# Patient Record
Sex: Female | Born: 1965 | Race: White | Hispanic: No | Marital: Married | Smoking: Former smoker
Health system: Southern US, Community
[De-identification: ages and names within clinical notes are randomized; demographics above are authoritative.]

---
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
## 2003-03-08 DIAGNOSIS — F419 Anxiety disorder, unspecified: Secondary | ICD-10-CM | POA: Insufficient documentation

## 2004-06-10 ENCOUNTER — Ambulatory Visit: Payer: Self-pay | Admitting: Family Medicine

## 2005-06-16 ENCOUNTER — Ambulatory Visit: Payer: Self-pay | Admitting: Family Medicine

## 2005-08-17 ENCOUNTER — Ambulatory Visit: Payer: Self-pay | Admitting: Otolaryngology

## 2006-08-09 ENCOUNTER — Ambulatory Visit: Payer: Self-pay | Admitting: Family Medicine

## 2006-12-01 ENCOUNTER — Ambulatory Visit: Payer: Self-pay | Admitting: Vascular Surgery

## 2007-06-14 ENCOUNTER — Ambulatory Visit: Payer: Self-pay | Admitting: Otolaryngology

## 2007-08-17 ENCOUNTER — Ambulatory Visit: Payer: Self-pay | Admitting: Family Medicine

## 2007-12-12 ENCOUNTER — Ambulatory Visit: Payer: Self-pay | Admitting: Otolaryngology

## 2008-06-04 ENCOUNTER — Ambulatory Visit: Payer: Self-pay | Admitting: Otolaryngology

## 2008-07-19 DIAGNOSIS — M129 Arthropathy, unspecified: Secondary | ICD-10-CM | POA: Insufficient documentation

## 2008-07-19 DIAGNOSIS — E041 Nontoxic single thyroid nodule: Secondary | ICD-10-CM | POA: Insufficient documentation

## 2008-09-05 ENCOUNTER — Ambulatory Visit: Payer: Self-pay

## 2009-03-20 IMAGING — US US THYROID
1 series · 17 of 25 positions shown · non-contrast
Comparison: none

REASON FOR EXAM: f/u Thyroid nodules
COMMENTS:

[Series 1: us thyroid · 17 of 29 slices shown]
[im 1/29]
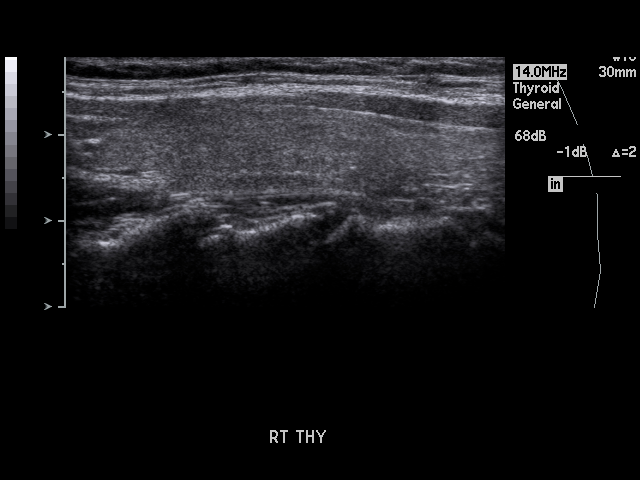
[im 3/29]
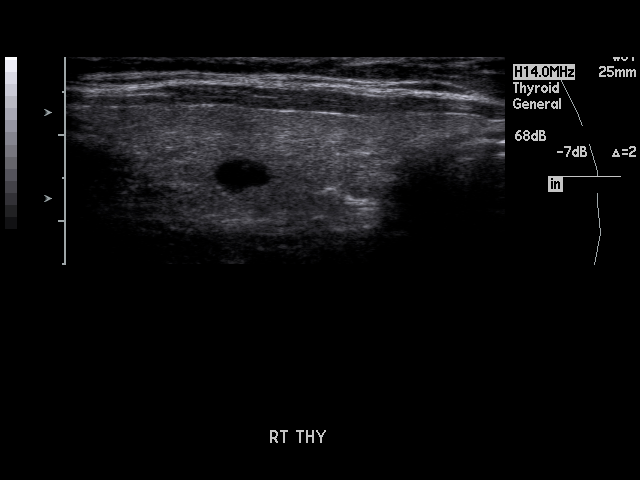
[im 4/29]
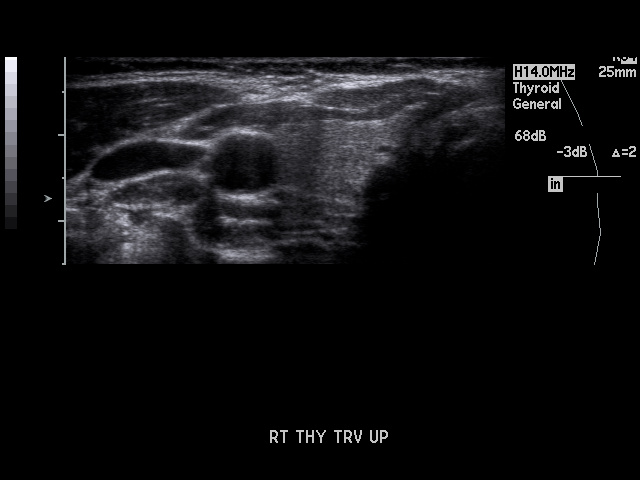
[im 6/29]
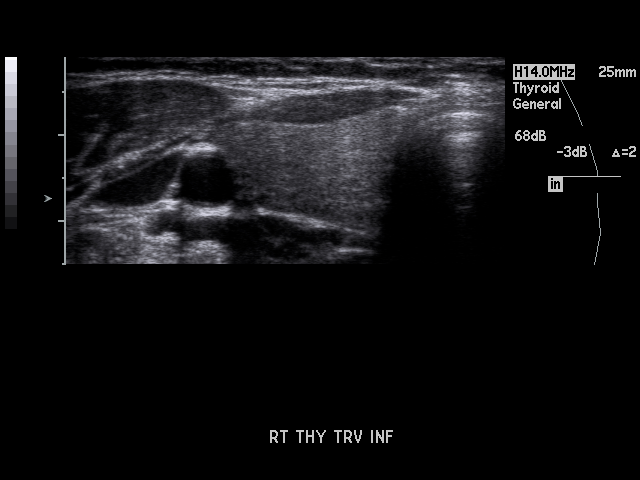
[im 8/29]
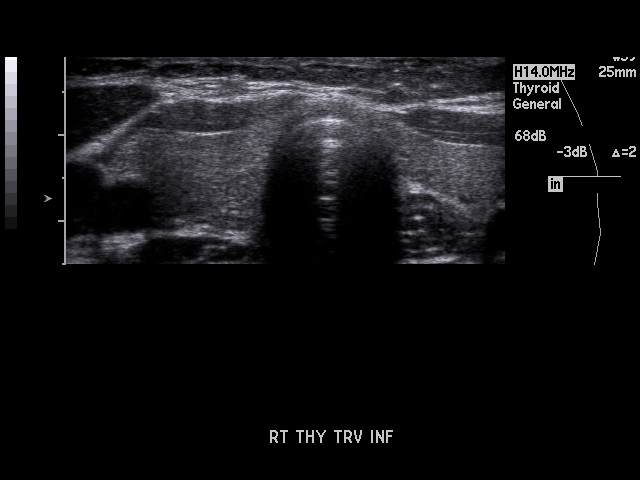
[im 10/29]
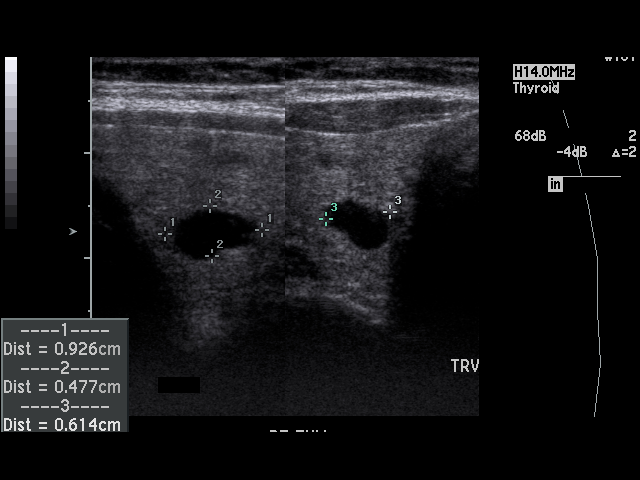
[im 11/29]
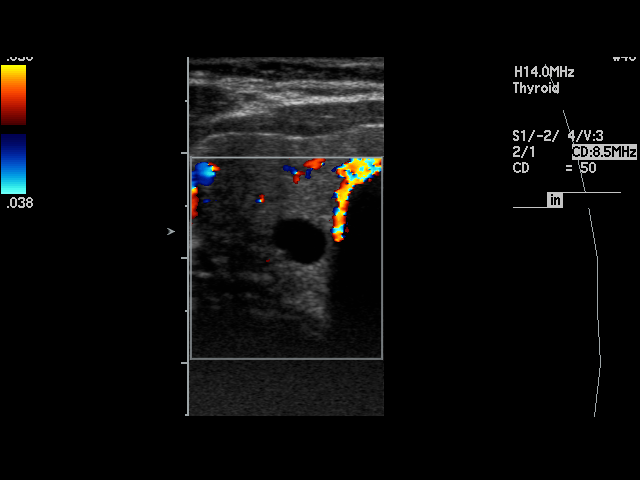
[im 13/29]
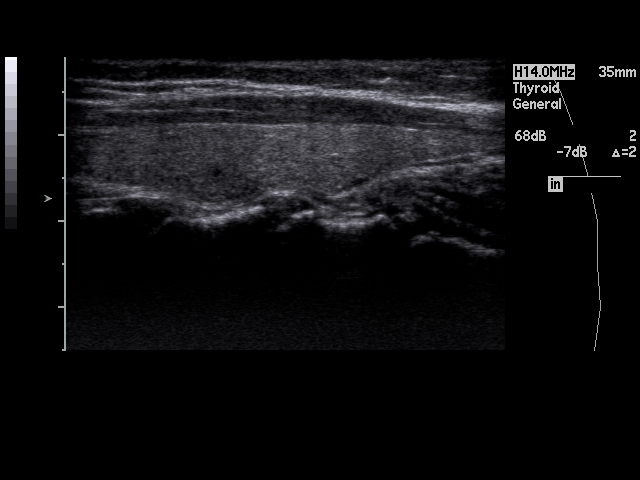
[im 15/29]
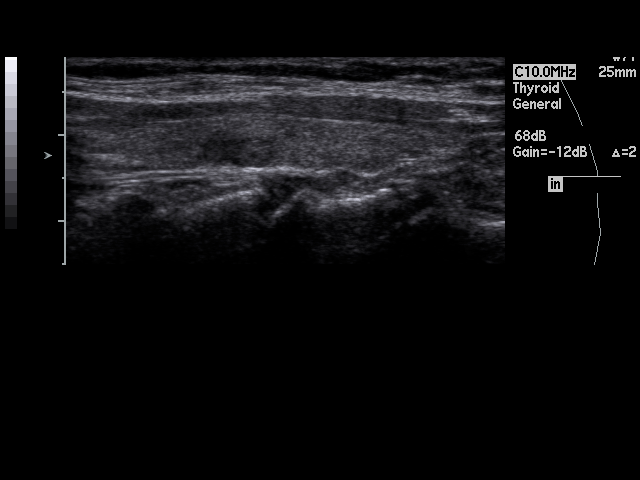
[im 16/29]
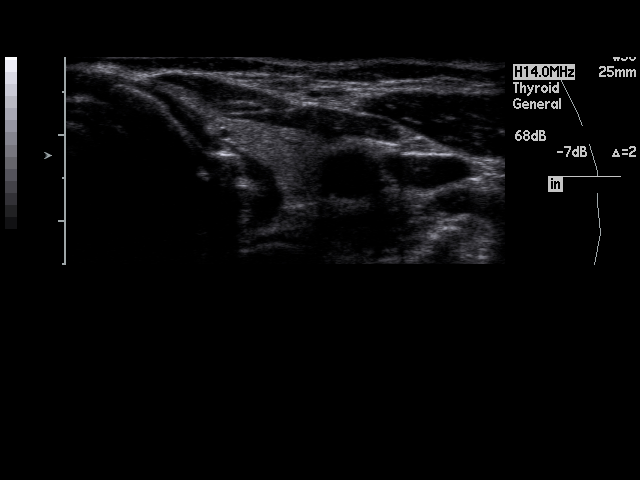
[im 18/29]
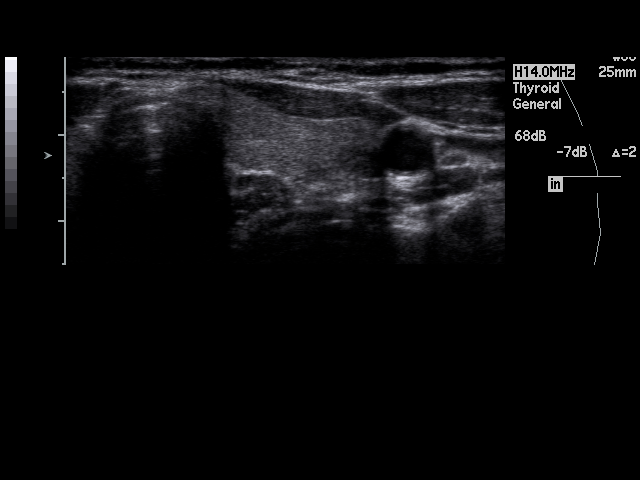
[im 19/29]
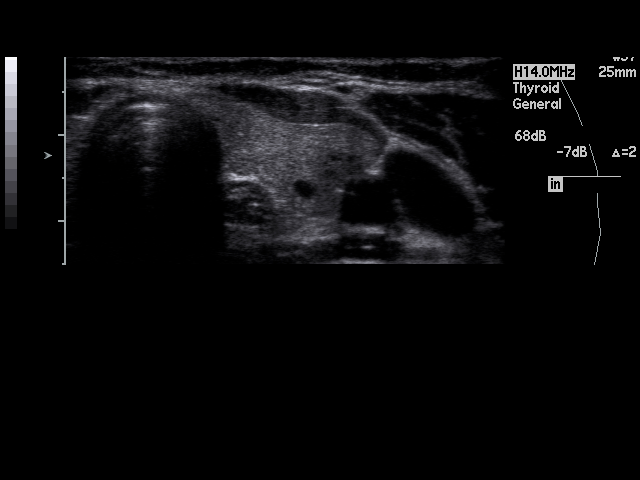
[im 22/29]
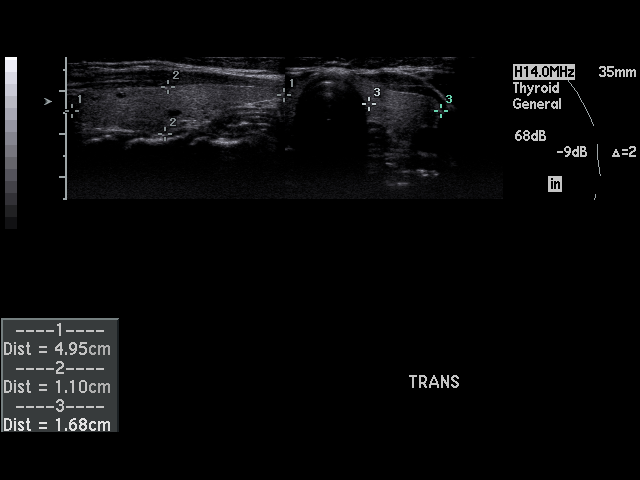
[im 23/29]
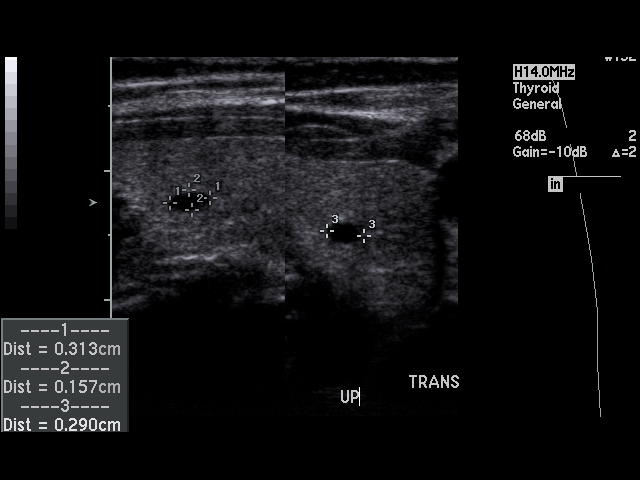
[im 25/29]
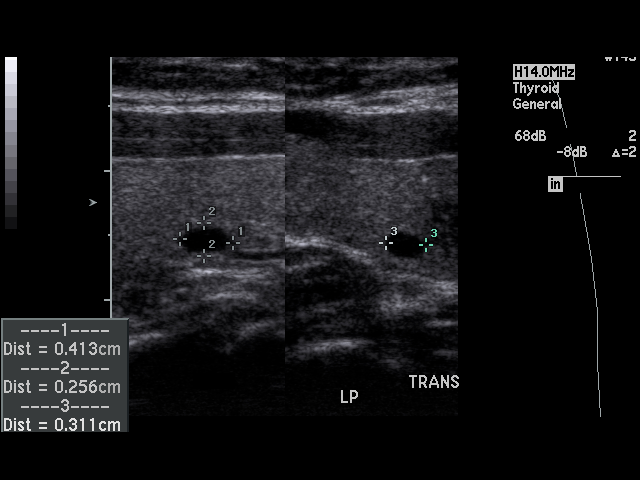
[im 26/29]
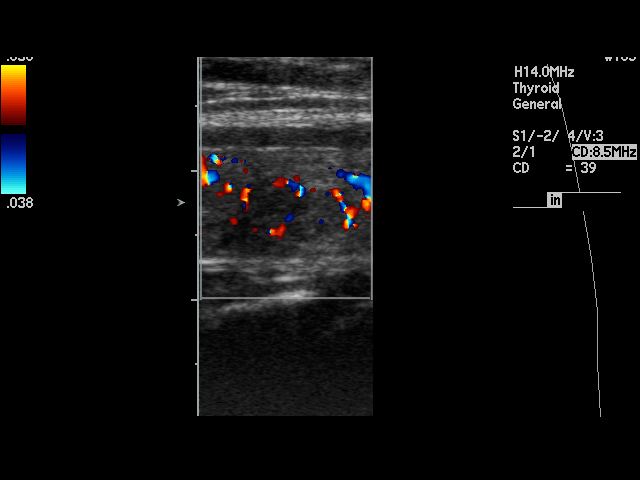
[im 29/29]
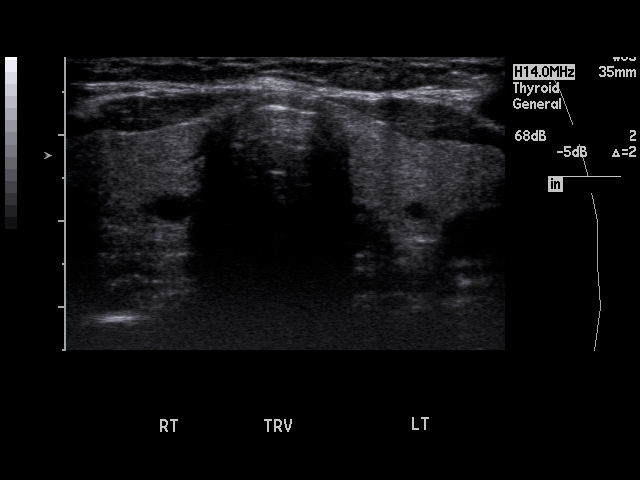

[17 of 25 positions shown; findings below may reference images not displayed]

PROCEDURE:     US  - US THYROID  - December 12, 2007 [DATE]

RESULT:     The RIGHT lobe of the thyroid measures 4.94 cm x 1.56 cm x
cm, and the LEFT lobe measures 4.95 cm x 1.1 cm x 1.6 cm.  There is a 9.3 mm
hypoechoic nodule consistent with a cyst or colloid cyst in the mid pole
region. On the LEFT, there are noted two tiny cystic-appearing nodules with
the smaller being at the upper pole and measuring 3.1 mm in diameter and the
smaller being at the lower pole and measuring 4.1 mm in diameter.
Additionally, there is a 1.03 cm hypoechoic solid nodule in the mid pole
region of the LEFT lobe. No associated microcalcifications are seen. No
other nodules are identified. The thyroid echotexture otherwise is
homogeneous.
IMPRESSION: 1.     There is a solid hypoechoic 1.033 cm nodule in the mid pole region of
the LEFT lobe.
2.     There are noted two small cystic-appearing nodules on the LEFT and
one on the RIGHT as reported above.
3.     The thyroid echotexture otherwise is homogeneous.

## 2009-06-05 ENCOUNTER — Ambulatory Visit: Payer: Self-pay | Admitting: Otolaryngology

## 2009-09-11 DIAGNOSIS — E669 Obesity, unspecified: Secondary | ICD-10-CM | POA: Insufficient documentation

## 2009-09-15 ENCOUNTER — Ambulatory Visit: Payer: Self-pay

## 2010-09-29 ENCOUNTER — Ambulatory Visit: Payer: Self-pay | Admitting: Family Medicine

## 2010-10-01 ENCOUNTER — Ambulatory Visit: Payer: Self-pay | Admitting: Family Medicine

## 2010-10-24 ENCOUNTER — Emergency Department: Payer: Self-pay | Admitting: Unknown Physician Specialty

## 2010-11-03 ENCOUNTER — Ambulatory Visit: Payer: Self-pay

## 2010-11-04 ENCOUNTER — Ambulatory Visit: Payer: Self-pay

## 2011-01-05 HISTORY — PX: KNEE SURGERY: SHX244

## 2011-01-15 ENCOUNTER — Encounter: Payer: Self-pay | Admitting: Orthopedic Surgery

## 2011-01-28 ENCOUNTER — Encounter: Payer: Self-pay | Admitting: Orthopedic Surgery

## 2011-02-27 ENCOUNTER — Encounter: Payer: Self-pay | Admitting: Orthopedic Surgery

## 2011-03-30 ENCOUNTER — Encounter: Payer: Self-pay | Admitting: Orthopedic Surgery

## 2011-04-30 ENCOUNTER — Encounter: Payer: Self-pay | Admitting: Orthopedic Surgery

## 2011-06-07 ENCOUNTER — Ambulatory Visit: Payer: Self-pay | Admitting: Otolaryngology

## 2011-11-16 ENCOUNTER — Ambulatory Visit: Payer: Self-pay | Admitting: Family Medicine

## 2012-06-07 ENCOUNTER — Ambulatory Visit: Payer: Self-pay | Admitting: Otolaryngology

## 2012-10-24 ENCOUNTER — Ambulatory Visit: Payer: Self-pay | Admitting: Family Medicine

## 2012-12-20 ENCOUNTER — Ambulatory Visit: Payer: Self-pay | Admitting: Family Medicine

## 2013-07-03 LAB — CBC AND DIFFERENTIAL
HCT: 40 % (ref 36–46)
Hemoglobin: 13.5 g/dL (ref 12.0–16.0)
PLATELETS: 297 10*3/uL (ref 150–399)
WBC: 7 10^3/mL

## 2013-07-03 LAB — HEPATIC FUNCTION PANEL
ALK PHOS: 79 U/L (ref 25–125)
ALT: 29 U/L (ref 7–35)
AST: 22 U/L (ref 13–35)
BILIRUBIN, TOTAL: 0.3 mg/dL

## 2013-07-03 LAB — HEMOGLOBIN A1C: HEMOGLOBIN A1C: 6 % (ref 4.0–6.0)

## 2013-07-03 LAB — TSH: TSH: 1.92 u[IU]/mL (ref 0.41–5.90)

## 2013-07-03 LAB — LIPID PANEL
Cholesterol: 154 mg/dL (ref 0–200)
HDL: 57 mg/dL (ref 35–70)
LDL CALC: 66 mg/dL
TRIGLYCERIDES: 154 mg/dL (ref 40–160)

## 2013-07-03 LAB — BASIC METABOLIC PANEL
BUN: 17 mg/dL (ref 4–21)
CREATININE: 0.9 mg/dL (ref 0.5–1.1)
GLUCOSE: 104 mg/dL
Potassium: 4.4 mmol/L (ref 3.4–5.3)
SODIUM: 138 mmol/L (ref 137–147)

## 2013-11-27 LAB — HM PAP SMEAR: HM Pap smear: NEGATIVE

## 2014-03-04 ENCOUNTER — Ambulatory Visit: Payer: Self-pay | Admitting: Gastroenterology

## 2014-04-03 ENCOUNTER — Ambulatory Visit: Payer: Self-pay | Admitting: Family Medicine

## 2014-11-22 DIAGNOSIS — E041 Nontoxic single thyroid nodule: Secondary | ICD-10-CM | POA: Insufficient documentation

## 2014-11-22 DIAGNOSIS — M1990 Unspecified osteoarthritis, unspecified site: Secondary | ICD-10-CM | POA: Insufficient documentation

## 2014-11-22 DIAGNOSIS — O039 Complete or unspecified spontaneous abortion without complication: Secondary | ICD-10-CM | POA: Insufficient documentation

## 2014-11-22 DIAGNOSIS — R6882 Decreased libido: Secondary | ICD-10-CM | POA: Insufficient documentation

## 2014-11-22 DIAGNOSIS — J45909 Unspecified asthma, uncomplicated: Secondary | ICD-10-CM | POA: Insufficient documentation

## 2014-11-22 DIAGNOSIS — A630 Anogenital (venereal) warts: Secondary | ICD-10-CM | POA: Insufficient documentation

## 2014-11-22 DIAGNOSIS — K219 Gastro-esophageal reflux disease without esophagitis: Secondary | ICD-10-CM | POA: Insufficient documentation

## 2014-11-22 DIAGNOSIS — F329 Major depressive disorder, single episode, unspecified: Secondary | ICD-10-CM | POA: Insufficient documentation

## 2014-11-22 DIAGNOSIS — G4700 Insomnia, unspecified: Secondary | ICD-10-CM | POA: Insufficient documentation

## 2014-11-22 DIAGNOSIS — R011 Cardiac murmur, unspecified: Secondary | ICD-10-CM | POA: Insufficient documentation

## 2014-11-22 DIAGNOSIS — N926 Irregular menstruation, unspecified: Secondary | ICD-10-CM | POA: Insufficient documentation

## 2014-11-22 DIAGNOSIS — L409 Psoriasis, unspecified: Secondary | ICD-10-CM | POA: Insufficient documentation

## 2014-11-22 DIAGNOSIS — R7303 Prediabetes: Secondary | ICD-10-CM | POA: Insufficient documentation

## 2014-11-22 DIAGNOSIS — Z818 Family history of other mental and behavioral disorders: Secondary | ICD-10-CM | POA: Insufficient documentation

## 2014-11-22 DIAGNOSIS — E559 Vitamin D deficiency, unspecified: Secondary | ICD-10-CM | POA: Insufficient documentation

## 2014-11-22 DIAGNOSIS — F32A Depression, unspecified: Secondary | ICD-10-CM | POA: Insufficient documentation

## 2014-11-22 DIAGNOSIS — R195 Other fecal abnormalities: Secondary | ICD-10-CM | POA: Insufficient documentation

## 2014-11-22 DIAGNOSIS — R0683 Snoring: Secondary | ICD-10-CM | POA: Insufficient documentation

## 2014-12-06 ENCOUNTER — Ambulatory Visit (INDEPENDENT_AMBULATORY_CARE_PROVIDER_SITE_OTHER): Payer: BLUE CROSS/BLUE SHIELD | Admitting: Physician Assistant

## 2014-12-06 ENCOUNTER — Encounter: Payer: Self-pay | Admitting: Physician Assistant

## 2014-12-06 VITALS — BP 102/70 | HR 71 | Temp 98.1°F | Resp 16 | Ht 61.5 in | Wt 182.6 lb

## 2014-12-06 DIAGNOSIS — E041 Nontoxic single thyroid nodule: Secondary | ICD-10-CM

## 2014-12-06 DIAGNOSIS — R7303 Prediabetes: Secondary | ICD-10-CM

## 2014-12-06 DIAGNOSIS — Z0000 Encounter for general adult medical examination without abnormal findings: Secondary | ICD-10-CM

## 2014-12-06 DIAGNOSIS — N926 Irregular menstruation, unspecified: Secondary | ICD-10-CM | POA: Diagnosis not present

## 2014-12-06 DIAGNOSIS — R7309 Other abnormal glucose: Secondary | ICD-10-CM | POA: Diagnosis not present

## 2014-12-06 DIAGNOSIS — N951 Menopausal and female climacteric states: Secondary | ICD-10-CM | POA: Diagnosis not present

## 2014-12-06 DIAGNOSIS — Z23 Encounter for immunization: Secondary | ICD-10-CM | POA: Diagnosis not present

## 2014-12-06 NOTE — Patient Instructions (Signed)
Health Maintenance Adopting a healthy lifestyle and getting preventive care can go a long way to promote health and wellness. Talk with your health care provider about what schedule of regular examinations is right for you. This is a good chance for you to check in with your provider about disease prevention and staying healthy. In between checkups, there are plenty of things you can do on your own. Experts have done a lot of research about which lifestyle changes and preventive measures are most likely to keep you healthy. Ask your health care provider for more information. WEIGHT AND DIET  Eat a healthy diet  Be sure to include plenty of vegetables, fruits, low-fat dairy products, and lean protein.  Do not eat a lot of foods high in solid fats, added sugars, or salt.  Get regular exercise. This is one of the most important things you can do for your health.  Most adults should exercise for at least 150 minutes each week. The exercise should increase your heart rate and make you sweat (moderate-intensity exercise).  Most adults should also do strengthening exercises at least twice a week. This is in addition to the moderate-intensity exercise.  Maintain a healthy weight  Body mass index (BMI) is a measurement that can be used to identify possible weight problems. It estimates body fat based on height and weight. Your health care provider can help determine your BMI and help you achieve or maintain a healthy weight.  For females 25 years of age and older:   A BMI below 18.5 is considered underweight.  A BMI of 18.5 to 24.9 is normal.  A BMI of 25 to 29.9 is considered overweight.  A BMI of 30 and above is considered obese.  Watch levels of cholesterol and blood lipids  You should start having your blood tested for lipids and cholesterol at 51 years of age, then have this test every 5 years.  You may need to have your cholesterol levels checked more often if:  Your lipid or  cholesterol levels are high.  You are older than 51 years of age.  You are at high risk for heart disease.  CANCER SCREENING   Lung Cancer  Lung cancer screening is recommended for adults 97-92 years old who are at high risk for lung cancer because of a history of smoking.  A yearly low-dose CT scan of the lungs is recommended for people who:  Currently smoke.  Have quit within the past 15 years.  Have at least a 30-pack-year history of smoking. A pack year is smoking an average of one pack of cigarettes a day for 1 year.  Yearly screening should continue until it has been 15 years since you quit.  Yearly screening should stop if you develop a health problem that would prevent you from having lung cancer treatment.  Breast Cancer  Practice breast self-awareness. This means understanding how your breasts normally appear and feel.  It also means doing regular breast self-exams. Let your health care provider know about any changes, no matter how small.  If you are in your 20s or 30s, you should have a clinical breast exam (CBE) by a health care provider every 1-3 years as part of a regular health exam.  If you are 76 or older, have a CBE every year. Also consider having a breast X-ray (mammogram) every year.  If you have a family history of breast cancer, talk to your health care provider about genetic screening.  If you are  at high risk for breast cancer, talk to your health care provider about having an MRI and a mammogram every year.  Breast cancer gene (BRCA) assessment is recommended for women who have family members with BRCA-related cancers. BRCA-related cancers include:  Breast.  Ovarian.  Tubal.  Peritoneal cancers.  Results of the assessment will determine the need for genetic counseling and BRCA1 and BRCA2 testing. Cervical Cancer Routine pelvic examinations to screen for cervical cancer are no longer recommended for nonpregnant women who are considered low  risk for cancer of the pelvic organs (ovaries, uterus, and vagina) and who do not have symptoms. A pelvic examination may be necessary if you have symptoms including those associated with pelvic infections. Ask your health care provider if a screening pelvic exam is right for you.   The Pap test is the screening test for cervical cancer for women who are considered at risk.  If you had a hysterectomy for a problem that was not cancer or a condition that could lead to cancer, then you no longer need Pap tests.  If you are older than 65 years, and you have had normal Pap tests for the past 10 years, you no longer need to have Pap tests.  If you have had past treatment for cervical cancer or a condition that could lead to cancer, you need Pap tests and screening for cancer for at least 20 years after your treatment.  If you no longer get a Pap test, assess your risk factors if they change (such as having a new sexual partner). This can affect whether you should start being screened again.  Some women have medical problems that increase their chance of getting cervical cancer. If this is the case for you, your health care provider may recommend more frequent screening and Pap tests.  The human papillomavirus (HPV) test is another test that may be used for cervical cancer screening. The HPV test looks for the virus that can cause cell changes in the cervix. The cells collected during the Pap test can be tested for HPV.  The HPV test can be used to screen women 30 years of age and older. Getting tested for HPV can extend the interval between normal Pap tests from three to five years.  An HPV test also should be used to screen women of any age who have unclear Pap test results.  After 51 years of age, women should have HPV testing as often as Pap tests.  Colorectal Cancer  This type of cancer can be detected and often prevented.  Routine colorectal cancer screening usually begins at 50 years of  age and continues through 51 years of age.  Your health care provider may recommend screening at an earlier age if you have risk factors for colon cancer.  Your health care provider may also recommend using home test kits to check for hidden blood in the stool.  A small camera at the end of a tube can be used to examine your colon directly (sigmoidoscopy or colonoscopy). This is done to check for the earliest forms of colorectal cancer.  Routine screening usually begins at age 50.  Direct examination of the colon should be repeated every 5-10 years through 51 years of age. However, you may need to be screened more often if early forms of precancerous polyps or small growths are found. Skin Cancer  Check your skin from head to toe regularly.  Tell your health care provider about any new moles or changes in   moles, especially if there is a change in a mole's shape or color.  Also tell your health care provider if you have a mole that is larger than the size of a pencil eraser.  Always use sunscreen. Apply sunscreen liberally and repeatedly throughout the day.  Protect yourself by wearing long sleeves, pants, a wide-brimmed hat, and sunglasses whenever you are outside. HEART DISEASE, DIABETES, AND HIGH BLOOD PRESSURE   Have your blood pressure checked at least every 1-2 years. High blood pressure causes heart disease and increases the risk of stroke.  If you are between 75 years and 42 years old, ask your health care provider if you should take aspirin to prevent strokes.  Have regular diabetes screenings. This involves taking a blood sample to check your fasting blood sugar level.  If you are at a normal weight and have a low risk for diabetes, have this test once every three years after 51 years of age.  If you are overweight and have a high risk for diabetes, consider being tested at a younger age or more often. PREVENTING INFECTION  Hepatitis B  If you have a higher risk for  hepatitis B, you should be screened for this virus. You are considered at high risk for hepatitis B if:  You were born in a country where hepatitis B is common. Ask your health care provider which countries are considered high risk.  Your parents were born in a high-risk country, and you have not been immunized against hepatitis B (hepatitis B vaccine).  You have HIV or AIDS.  You use needles to inject street drugs.  You live with someone who has hepatitis B.  You have had sex with someone who has hepatitis B.  You get hemodialysis treatment.  You take certain medicines for conditions, including cancer, organ transplantation, and autoimmune conditions. Hepatitis C  Blood testing is recommended for:  Everyone born from [birth year] through 1967.  Anyone with known risk factors for hepatitis C. Sexually transmitted infections (STIs)  You should be screened for sexually transmitted infections (STIs) including gonorrhea and chlamydia if:  You are sexually active and are younger than 51 years of age.  You are older than 51 years of age and your health care provider tells you that you are at risk for this type of infection.  Your sexual activity has changed since you were last screened and you are at an increased risk for chlamydia or gonorrhea. Ask your health care provider if you are at risk.  If you do not have HIV, but are at risk, it may be recommended that you take a prescription medicine daily to prevent HIV infection. This is called pre-exposure prophylaxis (PrEP). You are considered at risk if:  You are sexually active and do not regularly use condoms or know the HIV status of your partner(s).  You take drugs by injection.  You are sexually active with a partner who has HIV. Talk with your health care provider about whether you are at high risk of being infected with HIV. If you choose to begin PrEP, you should first be tested for HIV. You should then be tested every 3 months for  as long as you are taking PrEP.  PREGNANCY   If you are premenopausal and you may become pregnant, ask your health care provider about preconception counseling.  If you may become pregnant, take 400 to 800 micrograms (mcg) of folic acid every day.  If you want to prevent pregnancy, talk to your  health care provider about birth control (contraception). OSTEOPOROSIS AND MENOPAUSE   Osteoporosis is a disease in which the bones lose minerals and strength with aging. This can result in serious bone fractures. Your risk for osteoporosis can be identified using a bone density scan.  If you are 65 years of age or older, or if you are at risk for osteoporosis and fractures, ask your health care provider if you should be screened.  Ask your health care provider whether you should take a calcium or vitamin D supplement to lower your risk for osteoporosis.  Menopause may have certain physical symptoms and risks.  Hormone replacement therapy may reduce some of these symptoms and risks. Talk to your health care provider about whether hormone replacement therapy is right for you.  HOME CARE INSTRUCTIONS   Schedule regular health, dental, and eye exams.  Stay current with your immunizations.   Do not use any tobacco products including cigarettes, chewing tobacco, or electronic cigarettes.  If you are pregnant, do not drink alcohol.  If you are breastfeeding, limit how much and how often you drink alcohol.  Limit alcohol intake to no more than 1 drink per day for nonpregnant women. One drink equals 12 ounces of beer, 5 ounces of wine, or 1 ounces of hard liquor.  Do not use street drugs.  Do not share needles.  Ask your health care provider for help if you need support or information about quitting drugs.  Tell your health care provider if you often feel depressed.  Tell your health care provider if you have ever been abused or do not feel safe at home. Document Released: 09/28/2010  Document Revised: 07/30/2013 Document Reviewed: 02/14/2013 [employer] Patient Information 2015 [employer]. This information is not intended to replace advice given to you by your health care provider. Make sure you discuss any questions you have with your health care provider. Menopause Menopause is the normal time of life when menstrual periods stop completely. Menopause is complete when you have missed 12 consecutive menstrual periods. It usually occurs between the ages of 48 years and 55 years. Very rarely does a woman develop menopause before the age of 40 years. At menopause, your ovaries stop producing the female hormones estrogen and progesterone. This can cause undesirable symptoms and also affect your health. Sometimes the symptoms may occur 4-5 years before the menopause begins. There is no relationship between menopause and:  Oral contraceptives.  Number of children you had.  Race.  The age your menstrual periods started (menarche). Heavy smokers and very thin women may develop menopause earlier in life. CAUSES  The ovaries stop producing the female hormones estrogen and progesterone.  Other causes include:  Surgery to remove both ovaries.  The ovaries stop functioning for no known reason.  Tumors of the pituitary gland in the brain.  Medical disease that affects the ovaries and hormone production.  Radiation treatment to the abdomen or pelvis.  Chemotherapy that affects the ovaries. SYMPTOMS   Hot flashes.  Night sweats.  Decrease in sex drive.  Vaginal dryness and thinning of the vagina causing painful intercourse.  Dryness of the skin and developing wrinkles.  Headaches.  Tiredness.  Irritability.  Memory problems.  Weight gain.  Bladder infections.  Hair growth of the face and chest.  Infertility. More serious symptoms include:  Loss of bone (osteoporosis) causing breaks (fractures).  Depression.  Hardening and narrowing of the arteries  (atherosclerosis) causing heart attacks and strokes. DIAGNOSIS   When the menstrual   menstrual periods have stopped for 12 straight months.  Physical exam.  Hormone studies of the blood. TREATMENT  There are many treatment choices and nearly as many questions about them. The decisions to treat or not to treat menopausal changes is an individual choice made with your health care provider. Your health care provider can discuss the treatments with you. Together, you can decide which treatment will work best for you. Your treatment choices may include:   Hormone therapy (estrogen and progesterone).  Non-hormonal medicines.  Treating the individual symptoms with medicine (for example antidepressants for depression).  Herbal medicines that may help specific symptoms.  Counseling by a psychiatrist or psychologist.  Group therapy.  Lifestyle changes including:  Eating healthy.  Regular exercise.  Limiting caffeine and alcohol.  Stress management and meditation.  No treatment. HOME CARE INSTRUCTIONS   Take the medicine your health care provider gives you as directed.  Get plenty of sleep and rest.  Exercise regularly.  Eat a diet that contains calcium (good for the bones) and soy products (acts like estrogen hormone).  Avoid alcoholic beverages.  Do not smoke.  If you have hot flashes, dress in layers.  Take supplements, calcium, and vitamin D to strengthen bones.  You can use over-the-counter lubricants or moisturizers for vaginal dryness.  Group therapy is sometimes very helpful.  Acupuncture may be helpful in some cases. SEEK MEDICAL CARE IF:   You are not sure you are in menopause.  You are having menopausal symptoms and need advice and treatment.  You are still having menstrual periods after age 44 years.  You have pain with intercourse.  Menopause is complete (no menstrual period for 12 months) and you develop vaginal bleeding.  You need a referral to a  specialist (gynecologist, psychiatrist, or psychologist) for treatment. SEEK IMMEDIATE MEDICAL CARE IF:   You have severe depression.  You have excessive vaginal bleeding.  You fell and think you have a broken bone.  You have pain when you urinate.  You develop leg or chest pain.  You have a fast pounding heart beat (palpitations).  You have severe headaches.  You develop vision problems.  You feel a lump in your breast.  You have abdominal pain or severe indigestion. Document Released: 06/05/2003 Document Revised: 11/15/2012 Document Reviewed: 10/12/2012 [facility] Patient Information 2015 [employer] [place]. This information is not intended to replace advice given to you by your health care provider. Make sure you discuss any questions you have with your health care provider.

## 2014-12-06 NOTE — Progress Notes (Signed)
Patient: Tonya Zuniga, Female    DOB: Aug 27, 1965, 49 y.o.   MRN: [id number] Visit Date: 12/06/2014  Today's Provider: Margaretann Loveless, PA-C   Chief Complaint  Patient presents with  . Annual Exam   Subjective:    Annual physical exam Tonya Zuniga is a 49 y.o. female who presents today for health maintenance and complete physical. She feels well. She reports exercising,4-5 times a week on the treadmill for 30 minutes fast walk and weight lifting for 45 minutes twice a week. She reports she is sleeping poorly, reports sleeping 4 good hours due to sweat at night.   Last PCP:11/27/13 Mammogram:04/03/14 BI-RADS 1: Negative Colonoscopy: 03/04/14 Normal Pap Smear:11/27/13 Normal; HPV Negative EKG:09/2012 -----------------------------------------------------------------   Review of Systems  Constitutional: Positive for diaphoresis and fatigue.  HENT: Positive for tinnitus.   Eyes: Negative.   Respiratory: Negative.   Cardiovascular: Negative.   Gastrointestinal: Negative.   Endocrine: Negative.   Genitourinary: Negative.   Musculoskeletal: Negative.   Skin: Negative.   Hematological: Negative.   Psychiatric/Behavioral: Positive for sleep disturbance and decreased concentration.    Social History She  reports that she has quit smoking. She has never used smokeless tobacco. She reports that she drinks about 8.4 oz of alcohol per week. She reports that she does not use illicit drugs. Social History   Social History  . Marital Status: Married    Spouse Name: N/A  . Number of Children: N/A  . Years of Education: N/A   Social History Main Topics  . Smoking status: Former Games developer  . Smokeless tobacco: Never Used     Comment: Quit 1994  . Alcohol Use: 8.4 oz/week    14 Glasses of wine per week  . Drug Use: No  . Sexual Activity: Not Asked   Other Topics Concern  . None   Social History Narrative    Patient Active Problem List   Diagnosis Date Noted  . Abortion  11/22/2014  . Arthritis 11/22/2014  . Airway hyperreactivity 11/22/2014  . Undiagnosed cardiac murmurs 11/22/2014  . Clinical depression 11/22/2014  . Family history of psychiatric condition 11/22/2014  . Genital warts 11/22/2014  . Esophageal reflux 11/22/2014  . Fecal occult blood test positive 11/22/2014  . Cannot sleep 11/22/2014  . Irregular bleeding 11/22/2014  . Decreased libido 11/22/2014  . Borderline diabetes 11/22/2014  . 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 Snores 11/22/2014  . Thyroid nodule 11/22/2014  . Avitaminosis D 11/22/2014  . Adiposity 09/11/2009  . Arthropathia 07/19/2008  . Nontoxic uninodular goiter 07/19/2008  . Anxiety disorder 03/08/2003    Past Surgical History  Procedure Laterality Date  . Knee surgery Right 01/05/2011  . Appendectomy  1981    Family History  Family Status  Relation Status Death Age  . Mother Alive   . Father Alive   . Sister Alive   . Maternal Grandmother Deceased 67  . Maternal Grandfather Deceased 32  . Paternal Grandmother Deceased 24  . Paternal Grandfather Deceased 3   Her family history includes Alcohol abuse in her paternal grandfather; Arthritis in her mother and sister; Breast cancer in her paternal grandmother; Coronary artery disease in her father; Dementia in her maternal grandmother; Diabetes in her father; Heart attack in her maternal grandfather; Heart disease in her father; Hypertension in her sister; Meniere's disease in her father; Osteoporosis in her mother; Pancreatitis in her paternal grandfather; Pneumonia in her paternal grandmother.    No Known Allergies  Previous Medications   BUPROPION (WELLBUTRIN XL) 150 MG 24 HR TABLET    Take 1 tablet by mouth daily.   CALCIUM-MAGNESIUM-VITAMIN D (CALCIUM 500 PO)    1 tablet 2 (two) times daily as needed.   ESCITALOPRAM (LEXAPRO) 10 MG TABLET    Take 1.5 tablets by mouth daily.   FLAXSEED, LINSEED, PO    Take 1 tablet by mouth daily.   LORATADINE (CLARITIN  REDITABS) 10 MG DISSOLVABLE TABLET    Take 1 tablet by mouth daily.   MACA ROOT 500 MG CAPS    Take 1 capsule by mouth daily.   MELATONIN 10 MG CAPS    Take 1 capsule by mouth at bedtime.   METHYLSULFONYLMETHANE 500 MG CAPS    Take 1 capsule by mouth 2 (two) times daily.   MONTELUKAST (SINGULAIR) 10 MG TABLET    Take 1 tablet by mouth daily.   MULTIPLE VITAMIN PO    Take 1 tablet by mouth every other day.   RANITIDINE (ZANTAC) 150 MG TABLET    Take 1 tablet by mouth at bedtime.    Patient Care Team: Lorie Phenix, MD as PCP - General (Family Medicine)     Objective:   Vitals: BP 102/70 mmHg  Pulse 71  Temp(Src) 98.1 F (36.7 C) (Oral)  Resp 16  Ht 5' 1.5" (1.562 m)  Wt 182 lb 9.6 oz (82.827 kg)  BMI 33.95 kg/m2  LMP 10/23/2014   Physical Exam  Constitutional: She is oriented to person, place, and time. She appears well-developed and well-nourished. No distress.  HENT:  Head: Normocephalic and atraumatic.  Right Ear: External ear normal.  Left Ear: External ear normal.  Nose: Nose normal.  Mouth/Throat: Oropharynx is clear and moist. No oropharyngeal exudate.  Eyes: Conjunctivae and EOM are normal. Pupils are equal, round, and reactive to light. Right eye exhibits no discharge. Left eye exhibits no discharge. No scleral icterus.  Neck: Normal range of motion. Neck supple. No JVD present. Carotid bruit is not present. No tracheal deviation present. Thyroid mass (palpable thyroid nodule on left) present. No thyromegaly present.  Cardiovascular: Normal rate, regular rhythm, normal heart sounds and intact distal pulses.  Exam reveals no gallop and no friction rub.   No murmur heard. Pulmonary/Chest: Effort normal and breath sounds normal. No respiratory distress. She has no wheezes. She has no rales. She exhibits no tenderness. Right breast exhibits no inverted nipple, no mass, no nipple discharge, no skin change and no tenderness. Left breast exhibits no inverted nipple, no mass, no  nipple discharge, no skin change and no tenderness. Breasts are symmetrical.  Abdominal: Soft. Bowel sounds are normal. She exhibits no distension and no mass. There is no tenderness. There is no rebound and no guarding.  Genitourinary:  Pt deferred pelvic and rectal exam  Musculoskeletal: Normal range of motion. She exhibits no edema or tenderness.  Lymphadenopathy:    She has no cervical adenopathy.  Neurological: She is alert and oriented to person, place, and time.  Skin: Skin is warm and dry. No rash noted. She is not diaphoretic.  Psychiatric: She has a normal mood and affect. Her behavior is normal. Judgment and thought content normal.  Vitals reviewed.    Depression Screen No flowsheet data found.    Assessment & Plan:     Routine Health Maintenance and Physical Exam  Exercise Activities and Dietary recommendations Goals    None      Immunization History  Administered Date(s) Administered  . Influenza,inj,Quad  PF,36+ Mos 12/06/2014  . Pneumococcal Polysaccharide-23 11/23/2012  . Td 09/10/2004  . Tdap 12/27/2009    Health Maintenance  Topic Date Due  . Hepatitis C Screening  08/25/1965  . HIV Screening  06/30/1978  . INFLUENZA VACCINE  10/28/2015  . MAMMOGRAM  04/03/2016  . PAP SMEAR  11/27/2016  . TETANUS/TDAP  12/28/2019  . COLONOSCOPY  03/04/2024      Discussed health benefits of physical activity, and encouraged her to engage in regular exercise appropriate for her age and condition.     1. Annual physical exam She has annual labs done through her employer. All labs have been stable with the exception of her hemoglobin A1c which was 5.8 most recently. She is trying to eat a low carbohydrate diet and exercising. They also monitor her thyroid levels secondary to a thyroid nodule. She has been seen by Dr. Marion Downer with [facility] in the past but states it has been many years. She would like a referral back to him for evaluation follow-up.  2.  Thyroid nodule Most recent thyroid panel was within normal limits. She has previously been seen by Dr. Marion Downer at [facility] for workup of the thyroid nodule. She states this was approximately 2-3 years ago, possibly longer. I will refer her back to Dr. Willeen Cass for follow-up evaluation of the thyroid nodule. - Ambulatory referral to ENT  3. Need for influenza vaccination Flu vaccine given today. She tolerated the immunization well. - Flu Vaccine QUAD 36+ mos IM  4. Irregular bleeding She states that in February 2016 she thought that she had had her last menstrual cycle. She did not have another menstrual cycle until 10/23/2014. She states this menstrual cycle was very heavy, with more cramping, and lasted for 9 days which is unusual for her. She has not had any irregular bleeding or spotting since that time. I did advise her that if she were to have any more bleeding such as this for her to please call the office and we may consider a referral to gynecology at that time to workup postmenopausal bleeding. She does state that she did have her hormone levels checked earlier this year and they did show that she was in a menopausal state.  5. Hot flash, menopausal New and worsening. She states that the hot flashes do not bother her much during the day and that she may only have one or 2 a week during the day. However at night she states that they've come more frequently and she has not been sleeping well over the last month or so. Unfortunately due to the fact that she still has her female organs and hesitant to start her on hormone replacement therapy. We did discuss non-hormonal therapies for hot flashes including venlafaxine and paroxetine, but the fact that she is R 80 on Wellbutrin and Lexapro for her depression and anxiety I did not feel comfortable adding another antidepressant medication to her regimen to control the hot flashes. We did discuss using sleep aids instead to see if we can get her  a good night sleep. She has used Ambien in the past and states that she had a bad experience once and discontinued use. She does currently take melatonin but it is not helping her sleep. It may also cause hot flashes as well so we discussed discontinuing melatonin and we'll switch her to over-the-counter Unisom. She is to call the office in 1-2 weeks if the Unisom is not working and we will switch  to another sleep aid.  6. Borderline diabetes She is being followed for this through her employer. Most recent hemoglobin A1c was 5.8 from September 2016. She is to continue her low carbohydrate, low-fat diet and exercise 4-5 times weekly as she is doing. --------------------------------------------------------------------

## 2014-12-13 ENCOUNTER — Other Ambulatory Visit: Payer: Self-pay | Admitting: Otolaryngology

## 2014-12-13 DIAGNOSIS — E041 Nontoxic single thyroid nodule: Secondary | ICD-10-CM

## 2014-12-19 ENCOUNTER — Ambulatory Visit: Payer: BLUE CROSS/BLUE SHIELD

## 2014-12-23 ENCOUNTER — Ambulatory Visit: Payer: BLUE CROSS/BLUE SHIELD

## 2014-12-24 ENCOUNTER — Ambulatory Visit
Admission: RE | Admit: 2014-12-24 | Discharge: 2014-12-24 | Disposition: A | Discharge status: Home / Self Care | Payer: BLUE CROSS/BLUE SHIELD | Source: Ambulatory Visit | Attending: Otolaryngology | Admitting: Otolaryngology

## 2014-12-24 DIAGNOSIS — E041 Nontoxic single thyroid nodule: Secondary | ICD-10-CM

## 2014-12-24 DIAGNOSIS — E042 Nontoxic multinodular goiter: Secondary | ICD-10-CM | POA: Insufficient documentation

## 2014-12-27 ENCOUNTER — Other Ambulatory Visit: Payer: Self-pay | Admitting: Family Medicine

## 2014-12-27 DIAGNOSIS — J309 Allergic rhinitis, unspecified: Secondary | ICD-10-CM

## 2015-02-06 ENCOUNTER — Other Ambulatory Visit: Payer: Self-pay

## 2015-02-06 DIAGNOSIS — F329 Major depressive disorder, single episode, unspecified: Secondary | ICD-10-CM

## 2015-02-06 DIAGNOSIS — F32A Depression, unspecified: Secondary | ICD-10-CM

## 2015-02-06 MED ORDER — BUPROPION HCL ER (XL) 150 MG PO TB24: 150.0000 mg | ORAL_TABLET | Freq: Every day | ORAL | Status: DC

## 2015-03-17 ENCOUNTER — Other Ambulatory Visit: Payer: Self-pay | Admitting: Family Medicine

## 2015-03-17 DIAGNOSIS — F329 Major depressive disorder, single episode, unspecified: Secondary | ICD-10-CM

## 2015-03-17 DIAGNOSIS — F32A Depression, unspecified: Secondary | ICD-10-CM

## 2015-03-17 MED ORDER — ESCITALOPRAM OXALATE 10 MG PO TABS: 15.0000 mg | ORAL_TABLET | Freq: Every day | ORAL | Status: DC

## 2015-03-17 MED ORDER — BUPROPION HCL ER (XL) 150 MG PO TB24: 150.0000 mg | ORAL_TABLET | Freq: Every day | ORAL | Status: DC

## 2015-03-17 NOTE — Telephone Encounter (Signed)
Pt called saying she is out of town for the holiday and left her RX at home.  [place] [facility] [place]  On [address]  The number at the store is [phone].  escitalopram (LEXAPRO) 10 MG tablet buPROPion (WELLBUTRIN XL) 150 MG 24 hr tablet  She only needs 2 week rx  Her call back is [phone]  Thanks Barth Kirkseri

## 2015-06-21 ENCOUNTER — Other Ambulatory Visit: Payer: Self-pay | Admitting: Family Medicine

## 2015-06-21 DIAGNOSIS — J309 Allergic rhinitis, unspecified: Secondary | ICD-10-CM

## 2015-06-24 ENCOUNTER — Other Ambulatory Visit: Payer: Self-pay | Admitting: Family Medicine

## 2015-06-25 NOTE — Telephone Encounter (Signed)
See refill request.

## 2015-07-07 ENCOUNTER — Other Ambulatory Visit: Payer: Self-pay | Admitting: Family Medicine

## 2015-07-07 DIAGNOSIS — Z1231 Encounter for screening mammogram for malignant neoplasm of breast: Secondary | ICD-10-CM

## 2015-07-09 ENCOUNTER — Telehealth: Payer: Self-pay | Admitting: Family Medicine

## 2015-07-09 ENCOUNTER — Ambulatory Visit
Admission: RE | Admit: 2015-07-09 | Discharge: 2015-07-09 | Disposition: A | Discharge status: Home / Self Care | Payer: BLUE CROSS/BLUE SHIELD | Source: Ambulatory Visit | Attending: Family Medicine | Admitting: Family Medicine

## 2015-07-09 DIAGNOSIS — N6459 Other signs and symptoms in breast: Secondary | ICD-10-CM

## 2015-07-09 DIAGNOSIS — Z1231 Encounter for screening mammogram for malignant neoplasm of breast: Secondary | ICD-10-CM

## 2015-07-09 NOTE — Telephone Encounter (Signed)
Pt called saying she went in to have her screening mamo but she had noticed a new dimple so they told her to call us and get an order for a diagnostic mamo.  She went to [facility].  Pt's call back is [phone]  Thanks Barth Kirkseri

## 2015-07-09 NOTE — Telephone Encounter (Signed)
Patient advised that orders are put in and to call [facility] back and make appointment. The reason was put in per patient finding. We may need to see patient first for this if [facility] does not approve the order without the note, will await to hear back-aa

## 2015-07-21 ENCOUNTER — Other Ambulatory Visit: Payer: Self-pay | Admitting: Family Medicine

## 2015-07-21 ENCOUNTER — Ambulatory Visit
Admission: RE | Admit: 2015-07-21 | Discharge: 2015-07-21 | Disposition: A | Discharge status: Home / Self Care | Payer: BLUE CROSS/BLUE SHIELD | Source: Ambulatory Visit | Attending: Family Medicine | Admitting: Family Medicine

## 2015-07-21 DIAGNOSIS — N6459 Other signs and symptoms in breast: Secondary | ICD-10-CM

## 2015-07-21 DIAGNOSIS — N6489 Other specified disorders of breast: Secondary | ICD-10-CM | POA: Diagnosis present

## 2015-08-12 ENCOUNTER — Other Ambulatory Visit: Payer: Self-pay | Admitting: Physician Assistant

## 2015-08-12 DIAGNOSIS — F32A Depression, unspecified: Secondary | ICD-10-CM

## 2015-08-12 DIAGNOSIS — F329 Major depressive disorder, single episode, unspecified: Secondary | ICD-10-CM

## 2015-09-07 ENCOUNTER — Other Ambulatory Visit: Payer: Self-pay | Admitting: Physician Assistant

## 2015-09-07 DIAGNOSIS — F329 Major depressive disorder, single episode, unspecified: Secondary | ICD-10-CM

## 2015-09-07 DIAGNOSIS — F32A Depression, unspecified: Secondary | ICD-10-CM

## 2015-10-10 ENCOUNTER — Other Ambulatory Visit: Payer: Self-pay | Admitting: Physician Assistant

## 2015-10-10 DIAGNOSIS — F329 Major depressive disorder, single episode, unspecified: Secondary | ICD-10-CM

## 2015-10-10 DIAGNOSIS — F32A Depression, unspecified: Secondary | ICD-10-CM

## 2015-11-10 ENCOUNTER — Other Ambulatory Visit: Payer: Self-pay

## 2015-11-10 DIAGNOSIS — J309 Allergic rhinitis, unspecified: Secondary | ICD-10-CM

## 2015-11-10 DIAGNOSIS — F329 Major depressive disorder, single episode, unspecified: Secondary | ICD-10-CM

## 2015-11-10 DIAGNOSIS — F32A Depression, unspecified: Secondary | ICD-10-CM

## 2015-11-10 MED ORDER — BUPROPION HCL ER (XL) 150 MG PO TB24: ORAL_TABLET | ORAL | 1 refills | Status: DC

## 2015-11-10 MED ORDER — ESCITALOPRAM OXALATE 10 MG PO TABS: ORAL_TABLET | ORAL | 1 refills | Status: DC

## 2015-11-10 MED ORDER — MONTELUKAST SODIUM 10 MG PO TABS: 10.0000 mg | ORAL_TABLET | Freq: Every day | ORAL | 1 refills | Status: DC

## 2015-11-10 MED ORDER — LORATADINE 10 MG PO TBDP: 10.0000 mg | ORAL_TABLET | Freq: Every day | ORAL | 1 refills | Status: DC

## 2015-11-10 NOTE — Telephone Encounter (Signed)
Pharmacy requesting 90 day supply

## 2015-12-10 ENCOUNTER — Ambulatory Visit (INDEPENDENT_AMBULATORY_CARE_PROVIDER_SITE_OTHER): Payer: BLUE CROSS/BLUE SHIELD | Admitting: Physician Assistant

## 2015-12-10 ENCOUNTER — Encounter: Payer: Self-pay | Admitting: Physician Assistant

## 2015-12-10 VITALS — BP 122/74 | HR 96 | Temp 99.4°F | Resp 16 | Ht 61.0 in | Wt 159.0 lb

## 2015-12-10 DIAGNOSIS — Z0000 Encounter for general adult medical examination without abnormal findings: Secondary | ICD-10-CM | POA: Diagnosis not present

## 2015-12-10 DIAGNOSIS — Z13220 Encounter for screening for lipoid disorders: Secondary | ICD-10-CM | POA: Diagnosis not present

## 2015-12-10 DIAGNOSIS — R7303 Prediabetes: Secondary | ICD-10-CM

## 2015-12-10 DIAGNOSIS — Z1159 Encounter for screening for other viral diseases: Secondary | ICD-10-CM

## 2015-12-10 DIAGNOSIS — N951 Menopausal and female climacteric states: Secondary | ICD-10-CM

## 2015-12-10 DIAGNOSIS — E041 Nontoxic single thyroid nodule: Secondary | ICD-10-CM

## 2015-12-10 DIAGNOSIS — Z136 Encounter for screening for cardiovascular disorders: Secondary | ICD-10-CM | POA: Diagnosis not present

## 2015-12-10 DIAGNOSIS — J069 Acute upper respiratory infection, unspecified: Secondary | ICD-10-CM

## 2015-12-10 MED ORDER — ESTRADIOL 0.5 MG PO TABS: 0.5000 mg | ORAL_TABLET | Freq: Every day | ORAL | 5 refills | Status: DC

## 2015-12-10 MED ORDER — MEDROXYPROGESTERONE ACETATE 2.5 MG PO TABS: 2.5000 mg | ORAL_TABLET | Freq: Every day | ORAL | 5 refills | Status: DC

## 2015-12-10 MED ORDER — AMOXICILLIN-POT CLAVULANATE 875-125 MG PO TABS: 1.0000 | ORAL_TABLET | Freq: Two times a day (BID) | ORAL | 0 refills | Status: DC

## 2015-12-10 NOTE — Progress Notes (Signed)
Patient: Tonya Zuniga, Female    DOB: [birth year], 50 y.o.   MRN: [id number] Visit Date: 12/10/2015  Today's Provider: Margaretann Loveless, PA-C   No chief complaint on file.  Subjective:    Annual physical exam Tonya Zuniga is a 50 y.o. female who presents today for health maintenance and complete physical. She feels fairly well. She reports exercising occasionally. She reports she is sleeping poorly due to an URI that she has had for the last week.    Review of Systems  Constitutional: Positive for activity change and fatigue. Negative for appetite change, chills, diaphoresis, fever and unexpected weight change.  HENT: Positive for congestion, postnasal drip, sinus pressure and sore throat.   Respiratory: Positive for cough. Negative for apnea, choking, chest tightness, shortness of breath, wheezing and stridor.   Cardiovascular: Negative.   Endocrine: Negative.   Genitourinary: Negative.   Musculoskeletal: Negative.   Skin: Negative.   Allergic/Immunologic: Negative.   Neurological: Negative.   Hematological: Negative.     Social History      She  reports that she has quit smoking. She has never used smokeless tobacco. She reports that she drinks about 8.4 oz of alcohol per week . She reports that she does not use drugs.       Social History   Social History  . Marital status: Married    Spouse name: N/A  . Number of children: N/A  . Years of education: N/A   Social History Main Topics  . Smoking status: Former Games developer  . Smokeless tobacco: Never Used     Comment: Quit 1994  . Alcohol use 8.4 oz/week    14 Glasses of wine per week  . Drug use: No  . Sexual activity: Not on file   Other Topics Concern  . Not on file   Social History Narrative  . No narrative on file    No past medical history on file.   Patient Active Problem List   Diagnosis Date Noted  . Allergic rhinitis 12/27/2014  . Hot flash, menopausal 12/06/2014  . Arthritis 11/22/2014  .  Airway hyperreactivity 11/22/2014  . Undiagnosed cardiac murmurs 11/22/2014  . Clinical depression 11/22/2014  . Family history of psychiatric condition 11/22/2014  . Esophageal reflux 11/22/2014  . Cannot sleep 11/22/2014  . Irregular bleeding 11/22/2014  . Decreased libido 11/22/2014  . Borderline diabetes 11/22/2014  . 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 Snores 11/22/2014  . Thyroid nodule 11/22/2014  . Avitaminosis D 11/22/2014  . Adiposity 09/11/2009  . Nontoxic uninodular goiter 07/19/2008  . Anxiety disorder 03/08/2003    Past Surgical History:  Procedure Laterality Date  . APPENDECTOMY  1981  . KNEE SURGERY Right 01/05/2011    Family History        Family Status  Relation Status  . Mother Alive  . Father Alive  . Sister Alive  . Maternal Grandmother Deceased at age 62  . Maternal Grandfather Deceased at age 41  . Paternal Grandmother Deceased at age 54  . Paternal Grandfather Deceased at age 29        Her family history includes Alcohol abuse in her paternal grandfather; Arthritis in her mother and sister; Breast cancer (age of onset: 18) in her paternal grandmother; Coronary artery disease in her father; Dementia in her maternal grandmother; Diabetes in her father; Heart attack in her maternal grandfather; Heart disease in her father; Hypertension in her sister; Meniere's disease in her father;  Osteoporosis in her mother; Pancreatitis in her paternal grandfather; Pneumonia in her paternal grandmother.    No Known Allergies  No outpatient prescriptions have been marked as taking for the 12/10/15 encounter (Appointment) with Margaretann LovelessJennifer M Parmvir Boomer, PA-C.    Patient Care Team: Margaretann LovelessJennifer M Shanah Guimaraes, PA-C as PCP - General (Family Medicine)     Objective:   Vitals: LMP 10/23/2014 Comment: It lasted 9 days and it was very heavy. After not having it for 6 months.   Physical Exam  Constitutional: She is oriented to person, place, and time. She appears well-developed and  well-nourished. No distress.  HENT:  Head: Normocephalic and atraumatic.  Right Ear: Tympanic membrane, external ear and ear canal normal.  Left Ear: Tympanic membrane, external ear and ear canal normal.  Nose: Mucosal edema present. Right sinus exhibits no maxillary sinus tenderness and no frontal sinus tenderness. Left sinus exhibits no maxillary sinus tenderness and no frontal sinus tenderness.  Mouth/Throat: Uvula is midline and mucous membranes are normal. Posterior oropharyngeal erythema present. No oropharyngeal exudate or posterior oropharyngeal edema.  Eyes: Conjunctivae and EOM are normal. Pupils are equal, round, and reactive to light. Right eye exhibits no discharge. Left eye exhibits no discharge. No scleral icterus.  Neck: Normal range of motion. Neck supple. No JVD present. No tracheal deviation present. No thyromegaly present.  Cardiovascular: Normal rate, regular rhythm, normal heart sounds and intact distal pulses.  Exam reveals no gallop and no friction rub.   No murmur heard. Pulmonary/Chest: Effort normal and breath sounds normal. No respiratory distress. She has no wheezes. She has no rales. She exhibits no tenderness. Right breast exhibits no inverted nipple, no mass, no nipple discharge, no skin change and no tenderness. Left breast exhibits no inverted nipple, no mass, no nipple discharge, no skin change and no tenderness. Breasts are symmetrical.  Abdominal: Soft. Bowel sounds are normal. She exhibits no distension and no mass. There is no tenderness. There is no rebound and no guarding.  Musculoskeletal: Normal range of motion. She exhibits no edema or tenderness.  Lymphadenopathy:    She has no cervical adenopathy.  Neurological: She is alert and oriented to person, place, and time.  Skin: Skin is warm and dry. No rash noted. She is not diaphoretic.  Psychiatric: She has a normal mood and affect. Her behavior is normal. Judgment and thought content normal.  Vitals  reviewed.   Depression Screen No flowsheet data found.    Assessment & Plan:     Routine Health Maintenance and Physical Exam  Exercise Activities and Dietary recommendations Goals    None      Immunization History  Administered Date(s) Administered  . Influenza,inj,Quad PF,36+ Mos 12/06/2014  . Pneumococcal Polysaccharide-23 11/23/2012  . Td 09/10/2004  . Tdap 12/27/2009    Health Maintenance  Topic Date Due  . Hepatitis C Screening  1965-09-28  . HIV Screening  06/30/1978  . INFLUENZA VACCINE  10/28/2015  . PAP SMEAR  11/27/2016  . MAMMOGRAM  07/20/2017  . TETANUS/TDAP  12/28/2019  . COLONOSCOPY  03/04/2024      Discussed health benefits of physical activity, and encouraged her to engage in regular exercise appropriate for her age and condition.    1. Annual physical exam Normal physical exam today. Will check labs as below and f/u pending lab results. If labs are stable and WNL she will not need to have these rechecked for one year at her next annual physical exam. She is to call the office in  the meantime if she has any acute issue, questions or concerns. - CBC with Differential - Comprehensive metabolic panel  2. Nontoxic uninodular goiter Stable. Will check labs as below and f/u pending results. - TSH  3. Borderline diabetes Diet controlled. Will check labs as below and f/u pending results. - Hemoglobin A1c  4. Hot flash, menopausal Worsening menopausal symptoms. She is currently already on Lexapro and Wellbutrin for her anxiety does I do not feel she would be a good candidate to add one of the other nonhormonal treatments. She does currently have her reproductive organs and we discussed in detail risk associated with starting hormonal replacement therapy with this. She voiced understanding and agrees to go ahead and start hormone replacement. We will start with low-dose estradiol and Provera. She is to call if this does not control symptoms completely. We  will monitor for adverse reactions with medications. - TSH - estradiol (ESTRACE) 0.5 MG tablet; Take 1 tablet (0.5 mg total) by mouth daily.  Dispense: 30 tablet; Refill: 5 - medroxyPROGESTERone (PROVERA) 2.5 MG tablet; Take 1 tablet (2.5 mg total) by mouth daily.  Dispense: 30 tablet; Refill: 5  5. Encounter for lipid screening for cardiovascular disease Will check labs as below and f/u pending results. - Lipid panel  6. Need for hepatitis C screening test - Hepatitis C antibody screen  7. Upper respiratory infection Worsening symptoms. She has had no improvement over the last week and a half. I will treat her with Augmentin as below. She is to call the office if there is no improvement in symptoms following treatment. - amoxicillin-clavulanate (AUGMENTIN) 875-125 MG tablet; Take 1 tablet by mouth 2 (two) times daily.  Dispense: 20 tablet; Refill: 0  --------------------------------------------------------------------    Margaretann Loveless, PA-C  [facility]

## 2015-12-10 NOTE — Patient Instructions (Signed)
Health Maintenance, Female Adopting a healthy lifestyle and getting preventive care can go a long way to promote health and wellness. Talk with your health care provider about what schedule of regular examinations is right for you. This is a good chance for you to check in with your provider about disease prevention and staying healthy. In between checkups, there are plenty of things you can do on your own. Experts have done a lot of research about which lifestyle changes and preventive measures are most likely to keep you healthy. Ask your health care provider for more information. WEIGHT AND DIET  Eat a healthy diet  Be sure to include plenty of vegetables, fruits, low-fat dairy products, and lean protein.  Do not eat a lot of foods high in solid fats, added sugars, or salt.  Get regular exercise. This is one of the most important things you can do for your health.  Most adults should exercise for at least 150 minutes each week. The exercise should increase your heart rate and make you sweat (moderate-intensity exercise).  Most adults should also do strengthening exercises at least twice a week. This is in addition to the moderate-intensity exercise.  Maintain a healthy weight  Body mass index (BMI) is a measurement that can be used to identify possible weight problems. It estimates body fat based on height and weight. Your health care provider can help determine your BMI and help you achieve or maintain a healthy weight.  For females 28 years of age and older:   A BMI below 18.5 is considered underweight.  A BMI of 18.5 to 24.9 is normal.  A BMI of 25 to 29.9 is considered overweight.  A BMI of 30 and above is considered obese.  Watch levels of cholesterol and blood lipids  You should start having your blood tested for lipids and cholesterol at 52 years of age, then have this test every 5 years.  You may need to have your cholesterol levels checked more often if:  Your lipid  or cholesterol levels are high.  You are older than 52 years of age.  You are at high risk for heart disease.  CANCER SCREENING   Lung Cancer  Lung cancer screening is recommended for adults 75-66 years old who are at high risk for lung cancer because of a history of smoking.  A yearly low-dose CT scan of the lungs is recommended for people who:  Currently smoke.  Have quit within the past 15 years.  Have at least a 30-pack-year history of smoking. A pack year is smoking an average of one pack of cigarettes a day for 1 year.  Yearly screening should continue until it has been 15 years since you quit.  Yearly screening should stop if you develop a health problem that would prevent you from having lung cancer treatment.  Breast Cancer  Practice breast self-awareness. This means understanding how your breasts normally appear and feel.  It also means doing regular breast self-exams. Let your health care provider know about any changes, no matter how small.  If you are in your 20s or 30s, you should have a clinical breast exam (CBE) by a health care provider every 1-3 years as part of a regular health exam.  If you are 25 or older, have a CBE every year. Also consider having a breast X-ray (mammogram) every year.  If you have a family history of breast cancer, talk to your health care provider about genetic screening.  If you  are at high risk for breast cancer, talk to your health care provider about having an MRI and a mammogram every year.  Breast cancer gene (BRCA) assessment is recommended for women who have family members with BRCA-related cancers. BRCA-related cancers include:  Breast.  Ovarian.  Tubal.  Peritoneal cancers.  Results of the assessment will determine the need for genetic counseling and BRCA1 and BRCA2 testing. Cervical Cancer Your health care provider may recommend that you be screened regularly for cancer of the pelvic organs (ovaries, uterus, and  vagina). This screening involves a pelvic examination, including checking for microscopic changes to the surface of your cervix (Pap test). You may be encouraged to have this screening done every 3 years, beginning at age 21.  For women ages 30-65, health care providers may recommend pelvic exams and Pap testing every 3 years, or they may recommend the Pap and pelvic exam, combined with testing for human papilloma virus (HPV), every 5 years. Some types of HPV increase your risk of cervical cancer. Testing for HPV may also be done on women of any age with unclear Pap test results.  Other health care providers may not recommend any screening for nonpregnant women who are considered low risk for pelvic cancer and who do not have symptoms. Ask your health care provider if a screening pelvic exam is right for you.  If you have had past treatment for cervical cancer or a condition that could lead to cancer, you need Pap tests and screening for cancer for at least 20 years after your treatment. If Pap tests have been discontinued, your risk factors (such as having a new sexual partner) need to be reassessed to determine if screening should resume. Some women have medical problems that increase the chance of getting cervical cancer. In these cases, your health care provider may recommend more frequent screening and Pap tests. Colorectal Cancer  This type of cancer can be detected and often prevented.  Routine colorectal cancer screening usually begins at 52 years of age and continues through 52 years of age.  Your health care provider may recommend screening at an earlier age if you have risk factors for colon cancer.  Your health care provider may also recommend using home test kits to check for hidden blood in the stool.  A small camera at the end of a tube can be used to examine your colon directly (sigmoidoscopy or colonoscopy). This is done to check for the earliest forms of colorectal  cancer.  Routine screening usually begins at age 50.  Direct examination of the colon should be repeated every 5-10 years through 52 years of age. However, you may need to be screened more often if early forms of precancerous polyps or small growths are found. Skin Cancer  Check your skin from head to toe regularly.  Tell your health care provider about any new moles or changes in moles, especially if there is a change in a mole's shape or color.  Also tell your health care provider if you have a mole that is larger than the size of a pencil eraser.  Always use sunscreen. Apply sunscreen liberally and repeatedly throughout the day.  Protect yourself by wearing long sleeves, pants, a wide-brimmed hat, and sunglasses whenever you are outside. HEART DISEASE, DIABETES, AND HIGH BLOOD PRESSURE   High blood pressure causes heart disease and increases the risk of stroke. High blood pressure is more likely to develop in:  People who have blood pressure in the high end   of the normal range (130-139/85-89 mm Hg).  People who are overweight or obese.  People who are African American.  If you are 38-23 years of age, have your blood pressure checked every 3-5 years. If you are 61 years of age or older, have your blood pressure checked every year. You should have your blood pressure measured twice--once when you are at a hospital or clinic, and once when you are not at a hospital or clinic. Record the average of the two measurements. To check your blood pressure when you are not at a hospital or clinic, you can use:  An automated blood pressure machine at a pharmacy.  A home blood pressure monitor.  If you are between 45 years and 39 years old, ask your health care provider if you should take aspirin to prevent strokes.  Have regular diabetes screenings. This involves taking a blood sample to check your fasting blood sugar level.  If you are at a normal weight and have a low risk for diabetes,  have this test once every three years after 52 years of age.  If you are overweight and have a high risk for diabetes, consider being tested at a younger age or more often. PREVENTING INFECTION  Hepatitis B  If you have a higher risk for hepatitis B, you should be screened for this virus. You are considered at high risk for hepatitis B if:  You were born in a country where hepatitis B is common. Ask your health care provider which countries are considered high risk.  Your parents were born in a high-risk country, and you have not been immunized against hepatitis B (hepatitis B vaccine).  You have HIV or AIDS.  You use needles to inject street drugs.  You live with someone who has hepatitis B.  You have had sex with someone who has hepatitis B.  You get hemodialysis treatment.  You take certain medicines for conditions, including cancer, organ transplantation, and autoimmune conditions. Hepatitis C  Blood testing is recommended for:  Everyone born from [birth year] through 1967.  Anyone with known risk factors for hepatitis C. Sexually transmitted infections (STIs)  You should be screened for sexually transmitted infections (STIs) including gonorrhea and chlamydia if:  You are sexually active and are younger than 52 years of age.  You are older than 53 years of age and your health care provider tells you that you are at risk for this type of infection.  Your sexual activity has changed since you were last screened and you are at an increased risk for chlamydia or gonorrhea. Ask your health care provider if you are at risk.  If you do not have HIV, but are at risk, it may be recommended that you take a prescription medicine daily to prevent HIV infection. This is called pre-exposure prophylaxis (PrEP). You are considered at risk if:  You are sexually active and do not regularly use condoms or know the HIV status of your partner(s).  You take drugs by injection.  You are sexually  active with a partner who has HIV. Talk with your health care provider about whether you are at high risk of being infected with HIV. If you choose to begin PrEP, you should first be tested for HIV. You should then be tested every 3 months for as long as you are taking PrEP.  PREGNANCY   If you are premenopausal and you may become pregnant, ask your health care provider about preconception counseling.  If you may  become pregnant, take 400 to 800 micrograms (mcg) of folic acid every day.  If you want to prevent pregnancy, talk to your health care provider about birth control (contraception). OSTEOPOROSIS AND MENOPAUSE   Osteoporosis is a disease in which the bones lose minerals and strength with aging. This can result in serious bone fractures. Your risk for osteoporosis can be identified using a bone density scan.  If you are 61 years of age or older, or if you are at risk for osteoporosis and fractures, ask your health care provider if you should be screened.  Ask your health care provider whether you should take a calcium or vitamin D supplement to lower your risk for osteoporosis.  Menopause may have certain physical symptoms and risks.  Hormone replacement therapy may reduce some of these symptoms and risks. Talk to your health care provider about whether hormone replacement therapy is right for you.  HOME CARE INSTRUCTIONS   Schedule regular health, dental, and eye exams.  Stay current with your immunizations.   Do not use any tobacco products including cigarettes, chewing tobacco, or electronic cigarettes.  If you are pregnant, do not drink alcohol.  If you are breastfeeding, limit how much and how often you drink alcohol.  Limit alcohol intake to no more than 1 drink per day for nonpregnant women. One drink equals 12 ounces of beer, 5 ounces of wine, or 1 ounces of hard liquor.  Do not use street drugs.  Do not share needles.  Ask your health care provider for help if  you need support or information about quitting drugs.  Tell your health care provider if you often feel depressed.  Tell your health care provider if you have ever been abused or do not feel safe at home.   This information is not intended to replace advice given to you by your health care provider. Make sure you discuss any questions you have with your health care provider.   Document Released: 09/28/2010 Document Revised: 04/05/2014 Document Reviewed: 02/14/2013 Elsevier Interactive Patient Education [employer].

## 2015-12-23 ENCOUNTER — Ambulatory Visit (INDEPENDENT_AMBULATORY_CARE_PROVIDER_SITE_OTHER): Payer: BLUE CROSS/BLUE SHIELD | Admitting: Physician Assistant

## 2015-12-23 ENCOUNTER — Encounter: Payer: Self-pay | Admitting: Physician Assistant

## 2015-12-23 VITALS — BP 110/66 | HR 72 | Temp 99.9°F | Resp 16 | Ht 61.0 in | Wt 162.0 lb

## 2015-12-23 DIAGNOSIS — R509 Fever, unspecified: Secondary | ICD-10-CM

## 2015-12-23 LAB — POCT URINALYSIS DIPSTICK
Bilirubin, UA: NEGATIVE
Glucose, UA: NEGATIVE
KETONES UA: NEGATIVE
Leukocytes, UA: NEGATIVE
Nitrite, UA: NEGATIVE
PH UA: 6
PROTEIN UA: NEGATIVE
Spec Grav, UA: 1.01
UROBILINOGEN UA: 0.2

## 2015-12-23 LAB — POCT INFLUENZA A/B
INFLUENZA A, POC: NEGATIVE
Influenza B, POC: NEGATIVE

## 2015-12-23 MED ORDER — DOXYCYCLINE HYCLATE 100 MG PO TABS: 100.0000 mg | ORAL_TABLET | Freq: Two times a day (BID) | ORAL | 0 refills | Status: DC

## 2015-12-23 NOTE — Progress Notes (Signed)
Patient: Tonya Zuniga Female    DOB: April 08, 1965   50 y.o.   MRN: [id number] Visit Date: 12/23/2015  Today's Provider: Margaretann Loveless, PA-C   Chief Complaint  Patient presents with  . Fever   Subjective:    Patient reports she did take Augmentin for a few days then stopped when symptoms improved. Patient reports she restarted taking Augmentin Saturday night. Patient reports she has also been taking ibuprofen 600 mg BID for fever and headache.   Fever: Patient presents with fevers up to 102.7 degrees. She has had the fever for 4 days, since Saturday.  Symptoms have been gradually worsening. Symptoms associated with the fever include chills and headache, and patient denies otitis symptoms, URI, UTI, or GI symptoms. Symptoms are worse at no particular time of day.  Symptoms have been gradually worsening since that time. Patient has been sleeping more. Appetite has been worse. Urine output has been stable. She has tried to alleviate the symptoms with ibuprofen with minimal relief, helps fever but mild headache remains. The patient has no known comorbidities (structural heart/valvular disease, prosthetic joints, immunocompromised state, recent dental work, known abscesses). Daycare:no. Exposure to tobacco: no. Exposure to someone else at home w/similar symptoms:no Exposure to someone else at daycare/school/work:no. No recent travel. No tick exposure.      No Known Allergies   Current Outpatient Prescriptions:  .  amoxicillin-clavulanate (AUGMENTIN) 875-125 MG tablet, Take 1 tablet by mouth 2 (two) times daily., Disp: 20 tablet, Rfl: 0 .  buPROPion (WELLBUTRIN XL) 150 MG 24 hr tablet, TAKE 1 TABLET (150 MG TOTAL) BY MOUTH DAILY., Disp: 90 tablet, Rfl: 1 .  Calcium-Magnesium-Vitamin D (CALCIUM 500 PO), 1 tablet 2 (two) times daily as needed., Disp: , Rfl:  .  escitalopram (LEXAPRO) 10 MG tablet, TAKE 1 & 1/2 TABLETS BY MOUTH ONCE A DAY, Disp: 135 tablet, Rfl: 1 .  estradiol (ESTRACE)  0.5 MG tablet, Take 1 tablet (0.5 mg total) by mouth daily., Disp: 30 tablet, Rfl: 5 .  FLAXSEED, LINSEED, PO, Take 1 tablet by mouth daily., Disp: , Rfl:  .  loratadine (CLARITIN REDITABS) 10 MG dissolvable tablet, Take 1 tablet (10 mg total) by mouth daily., Disp: 90 tablet, Rfl: 1 .  Maca Root 500 MG CAPS, Take 1 capsule by mouth daily., Disp: , Rfl:  .  medroxyPROGESTERone (PROVERA) 2.5 MG tablet, Take 1 tablet (2.5 mg total) by mouth daily., Disp: 30 tablet, Rfl: 5 .  Melatonin 10 MG CAPS, Take 1 capsule by mouth at bedtime., Disp: , Rfl:  .  Methylsulfonylmethane 500 MG CAPS, Take 1 capsule by mouth 2 (two) times daily., Disp: , Rfl:  .  montelukast (SINGULAIR) 10 MG tablet, Take 1 tablet (10 mg total) by mouth daily., Disp: 90 tablet, Rfl: 1 .  MULTIPLE VITAMIN PO, Take 1 tablet by mouth every other day., Disp: , Rfl:   Review of Systems  Constitutional: Positive for chills, fatigue and fever.  HENT: Positive for sinus pressure.   Respiratory: Negative.   Cardiovascular: Negative.   Gastrointestinal: Negative.   Genitourinary: Negative.   Neurological: Positive for headaches.    Social History  Substance Use Topics  . Smoking status: Former Games developer  . Smokeless tobacco: Never Used     Comment: Quit 1994  . Alcohol use 8.4 oz/week    14 Glasses of wine per week   Objective:   BP 110/66 (BP Location: Left Arm, Patient Position: Sitting, Cuff Size: Large)  Pulse 72   Temp 99.9 F (37.7 C) (Oral)   Resp 16   Ht 5\' 1"  (1.549 m)   Wt 162 lb (73.5 kg)   LMP 10/23/2014 (Exact Date) Comment: It lasted 9 days and it was very heavy. After not having it for 6 months.  SpO2 99%   BMI 30.61 kg/m   Physical Exam  Constitutional: She appears well-developed and well-nourished. No distress.  HENT:  Head: Normocephalic and atraumatic.  Right Ear: Hearing, tympanic membrane, external ear and ear canal normal.  Left Ear: Hearing, tympanic membrane, external ear and ear canal normal.    Nose: Nose normal.  Mouth/Throat: Uvula is midline, oropharynx is clear and moist and mucous membranes are normal. No oropharyngeal exudate.  Eyes: Conjunctivae are normal. Pupils are equal, round, and reactive to light. Right eye exhibits no discharge. Left eye exhibits no discharge. No scleral icterus.  Neck: Normal range of motion. Neck supple. No tracheal deviation present. No thyromegaly present.  Cardiovascular: Normal rate, regular rhythm and normal heart sounds.  Exam reveals no gallop and no friction rub.   No murmur heard. Pulmonary/Chest: Effort normal and breath sounds normal. No stridor. No respiratory distress. She has no wheezes. She has no rales.  Abdominal: Soft. Normal appearance and bowel sounds are normal. She exhibits no shifting dullness, no distension, no ascites and no mass. There is no hepatosplenomegaly. There is no tenderness. There is no rebound and no CVA tenderness.  Lymphadenopathy:    She has no cervical adenopathy.  Skin: Skin is warm and dry. She is not diaphoretic.  Vitals reviewed.     Assessment & Plan:     1. Fever, unspecified fever cause Urinalysis and flu test in the office were negative today. Unknown cause fever that is not responding to Augmentin. She is to get her labs drawn on Friday through her employer. I will await these results. I will switch her from Augmentin to doxycycline as below to offer better coverage to see if whatever infection she may be fighting off is resistant to Augmentin. She is also to continue alternating ibuprofen and Tylenol as needed for fever. Push fluids. She is to call the office if symptoms worsen or do not improve with the antibiotic change. - POCT Influenza A/B - POCT Urinalysis Dipstick - doxycycline (VIBRA-TABS) 100 MG tablet; Take 1 tablet (100 mg total) by mouth 2 (two) times daily.  Dispense: 14 tablet; Refill: 0       Margaretann LovelessJennifer M Burnette, PA-C  [facility]

## 2015-12-23 NOTE — Patient Instructions (Signed)
Fever, Adult A fever is an increase in the body's temperature. It is usually defined as a temperature of 100F (38C) or higher. Brief mild or moderate fevers generally have no long-term effects, and they often do not require treatment. Moderate or high fevers may make you feel uncomfortable and can sometimes be a sign of a serious illness or disease. The sweating that may occur with repeated or prolonged fever may also cause dehydration. Fever is confirmed by taking a temperature with a thermometer. A measured temperature can vary with:  Age.  Time of day.  Location of the thermometer:  Mouth (oral).  Rectum (rectal).  Ear (tympanic).  Underarm (axillary).  Forehead (temporal). HOME CARE INSTRUCTIONS Pay attention to any changes in your symptoms. Take these actions to help with your condition:  Take over-the counter and prescription medicines only as told by your health care provider. Follow the dosing instructions carefully.  If you were prescribed an antibiotic medicine, take it as told by your health care provider. Do not stop taking the antibiotic even if you start to feel better.  Rest as needed.  Drink enough fluid to keep your urine clear or pale yellow. This helps to prevent dehydration.  Sponge yourself or bathe with room-temperature water to help reduce your body temperature as needed. Do not use ice water.  Do not overbundle yourself in blankets or heavy clothes. SEEK MEDICAL CARE IF:  You vomit.  You cannot eat or drink without vomiting.  You have diarrhea.  You have pain when you urinate.  Your symptoms do not improve with treatment.  You develop new symptoms.  You develop excessive weakness. SEEK IMMEDIATE MEDICAL CARE IF:  You have shortness of breath or have trouble breathing.  You are dizzy or you faint.  You are disoriented or confused.  You develop signs of dehydration, such as a dry mouth, decreased urination, or paleness.  You develop  severe pain in your abdomen.  You have persistent vomiting or diarrhea.  You develop a skin rash.  Your symptoms suddenly get worse.   This information is not intended to replace advice given to you by your health care provider. Make sure you discuss any questions you have with your health care provider.   Document Released: 09/08/2000 Document Revised: 12/04/2014 Document Reviewed: 05/09/2014 Elsevier Interactive Patient Education 2016 [employer]  

## 2016-05-27 ENCOUNTER — Other Ambulatory Visit: Payer: Self-pay | Admitting: Physician Assistant

## 2016-05-27 DIAGNOSIS — N951 Menopausal and female climacteric states: Secondary | ICD-10-CM

## 2016-05-27 DIAGNOSIS — F329 Major depressive disorder, single episode, unspecified: Secondary | ICD-10-CM

## 2016-05-27 DIAGNOSIS — F32A Depression, unspecified: Secondary | ICD-10-CM

## 2016-05-27 DIAGNOSIS — J309 Allergic rhinitis, unspecified: Secondary | ICD-10-CM

## 2016-06-21 ENCOUNTER — Other Ambulatory Visit: Payer: Self-pay | Admitting: Family Medicine

## 2016-06-21 ENCOUNTER — Ambulatory Visit
Admission: RE | Admit: 2016-06-21 | Discharge: 2016-06-21 | Disposition: A | Discharge status: Home / Self Care | Payer: BLUE CROSS/BLUE SHIELD | Source: Ambulatory Visit | Attending: Family Medicine | Admitting: Family Medicine

## 2016-06-21 DIAGNOSIS — R52 Pain, unspecified: Secondary | ICD-10-CM

## 2016-06-21 DIAGNOSIS — W19XXXA Unspecified fall, initial encounter: Secondary | ICD-10-CM | POA: Insufficient documentation

## 2016-06-21 DIAGNOSIS — R0781 Pleurodynia: Secondary | ICD-10-CM | POA: Diagnosis present

## 2016-06-23 ENCOUNTER — Emergency Department: Payer: BLUE CROSS/BLUE SHIELD

## 2016-06-23 ENCOUNTER — Emergency Department
Admission: EM | Admit: 2016-06-23 | Discharge: 2016-06-23 | Disposition: A | Discharge status: Home / Self Care | Payer: BLUE CROSS/BLUE SHIELD | Attending: Emergency Medicine | Admitting: Emergency Medicine

## 2016-06-23 ENCOUNTER — Encounter: Payer: Self-pay | Admitting: Emergency Medicine

## 2016-06-23 DIAGNOSIS — Y939 Activity, unspecified: Secondary | ICD-10-CM | POA: Insufficient documentation

## 2016-06-23 DIAGNOSIS — Z87891 Personal history of nicotine dependence: Secondary | ICD-10-CM | POA: Insufficient documentation

## 2016-06-23 DIAGNOSIS — W010XXA Fall on same level from slipping, tripping and stumbling without subsequent striking against object, initial encounter: Secondary | ICD-10-CM | POA: Diagnosis not present

## 2016-06-23 DIAGNOSIS — S2241XA Multiple fractures of ribs, right side, initial encounter for closed fracture: Secondary | ICD-10-CM

## 2016-06-23 DIAGNOSIS — Y999 Unspecified external cause status: Secondary | ICD-10-CM | POA: Insufficient documentation

## 2016-06-23 DIAGNOSIS — S299XXA Unspecified injury of thorax, initial encounter: Secondary | ICD-10-CM | POA: Diagnosis present

## 2016-06-23 DIAGNOSIS — Y92009 Unspecified place in unspecified non-institutional (private) residence as the place of occurrence of the external cause: Secondary | ICD-10-CM | POA: Insufficient documentation

## 2016-06-23 MED ORDER — OXYCODONE-ACETAMINOPHEN 5-325 MG PO TABS: 1.0000 | ORAL_TABLET | Freq: Four times a day (QID) | ORAL | 0 refills | Status: DC | PRN

## 2016-06-23 MED ORDER — MECLIZINE HCL 25 MG PO TABS
ORAL_TABLET | ORAL | Status: AC
  Filled 2016-06-23: qty 1

## 2016-06-23 MED ORDER — NAPROXEN 500 MG PO TABS: 500.0000 mg | ORAL_TABLET | Freq: Two times a day (BID) | ORAL | 0 refills | Status: DC

## 2016-06-23 MED ORDER — LORAZEPAM 1 MG PO TABS
ORAL_TABLET | ORAL | Status: AC
  Filled 2016-06-23: qty 1

## 2016-06-23 MED ORDER — MORPHINE SULFATE (PF) 4 MG/ML IV SOLN
4.0000 mg | Freq: Once | INTRAVENOUS | Status: AC
  Administered 2016-06-23: 4 mg via INTRAVENOUS
  Filled 2016-06-23: qty 1

## 2016-06-23 MED ORDER — BACITRACIN ZINC 500 UNIT/GM EX OINT
TOPICAL_OINTMENT | CUTANEOUS | Status: AC
  Filled 2016-06-23: qty 0.9

## 2016-06-23 MED ORDER — KETOROLAC TROMETHAMINE 30 MG/ML IJ SOLN
15.0000 mg | INTRAMUSCULAR | Status: AC
  Administered 2016-06-23: 15 mg via INTRAMUSCULAR
  Filled 2016-06-23: qty 1

## 2016-06-23 MED ORDER — ONDANSETRON HCL 4 MG/2ML IJ SOLN
4.0000 mg | Freq: Once | INTRAMUSCULAR | Status: AC
  Administered 2016-06-23: 4 mg via INTRAVENOUS
  Filled 2016-06-23: qty 2

## 2016-06-23 MED ORDER — ONDANSETRON 4 MG PO TBDP
4.0000 mg | ORAL_TABLET | Freq: Three times a day (TID) | ORAL | 0 refills | Status: DC | PRN
Start: 2016-06-23 — End: 2017-04-08

## 2016-06-23 NOTE — ED Notes (Signed)
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

## 2016-06-23 NOTE — ED Provider Notes (Signed)
[facility] Emergency Department Provider Note  ____________________________________________  Time seen: Approximately 10:01 AM  I have reviewed the triage vital signs and the nursing notes.   HISTORY  Chief Complaint Pleurisy    HPI Tonya Zuniga is a 51 y.o. female who reports a trip and fall at home 2 days ago resulting in right-sided rib pain. Yesterday the pain got better but this morning she woke up with very severe pain. It hurts to breathe. Denies shortness of breath. No cough or hemoptysis. No dizziness or fever.     History reviewed. No pertinent past medical history.   Patient Active Problem List   Diagnosis Date Noted  . Allergic rhinitis 12/27/2014  . Hot flash, menopausal 12/06/2014  . Arthritis 11/22/2014  . Airway hyperreactivity 11/22/2014  . Undiagnosed cardiac murmurs 11/22/2014  . Clinical depression 11/22/2014  . Family history of psychiatric condition 11/22/2014  . Esophageal reflux 11/22/2014  . Cannot sleep 11/22/2014  . Irregular bleeding 11/22/2014  . Decreased libido 11/22/2014  . Borderline diabetes 11/22/2014  . 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 Snores 11/22/2014  . Avitaminosis D 11/22/2014  . Adiposity 09/11/2009  . Nontoxic uninodular goiter 07/19/2008  . Anxiety disorder 03/08/2003     Past Surgical History:  Procedure Laterality Date  . APPENDECTOMY  1981  . KNEE SURGERY Right 01/05/2011     Prior to Admission medications   Medication Sig Start Date End Date Taking? Authorizing Provider  buPROPion (WELLBUTRIN XL) 150 MG 24 hr tablet TAKE 1 TABLET (150 MG TOTAL) BY MOUTH DAILY. 05/27/16   Margaretann LovelessJennifer M Burnette, PA-C  Calcium-Magnesium-Vitamin D (CALCIUM 500 PO) 1 tablet 2 (two) times daily as needed.    Historical Provider, MD  CLARITIN REDITABS 10 MG dissolvable tablet TAKE 1 TABLET (10 MG TOTAL) BY MOUTH DAILY. 05/27/16   Margaretann LovelessJennifer M Burnette, PA-C  doxycycline (VIBRA-TABS) 100 MG tablet Take 1 tablet (100 mg  total) by mouth 2 (two) times daily. 12/23/15   Margaretann LovelessJennifer M Burnette, PA-C  escitalopram (LEXAPRO) 10 MG tablet TAKE 1 & 1/2 TABLETS BY MOUTH ONCE A DAY 05/27/16   Margaretann LovelessJennifer M Burnette, PA-C  estradiol (ESTRACE) 0.5 MG tablet TAKE 1 TABLET (0.5 MG TOTAL) BY MOUTH DAILY. 05/27/16   Margaretann LovelessJennifer M Burnette, PA-C  FLAXSEED, LINSEED, PO Take 1 tablet by mouth daily.    Historical Provider, MD  Maca Root 500 MG CAPS Take 1 capsule by mouth daily.    Historical Provider, MD  medroxyPROGESTERone (PROVERA) 2.5 MG tablet TAKE 1 TABLET (2.5 MG TOTAL) BY MOUTH DAILY. 05/27/16   Margaretann LovelessJennifer M Burnette, PA-C  Melatonin 10 MG CAPS Take 1 capsule by mouth at bedtime.    Historical Provider, MD  Methylsulfonylmethane 500 MG CAPS Take 1 capsule by mouth 2 (two) times daily.    Historical Provider, MD  montelukast (SINGULAIR) 10 MG tablet Take 1 tablet (10 mg total) by mouth daily. 11/10/15   Margaretann LovelessJennifer M Burnette, PA-C  MULTIPLE VITAMIN PO Take 1 tablet by mouth every other day.    Historical Provider, MD  naproxen (NAPROSYN) 500 MG tablet Take 1 tablet (500 mg total) by mouth 2 (two) times daily with a meal. 06/23/16   Sharman CheekPhillip Janiylah Hannis, MD  ondansetron (ZOFRAN ODT) 4 MG disintegrating tablet Take 1 tablet (4 mg total) by mouth every 8 (eight) hours as needed for nausea or vomiting. 06/23/16   Sharman CheekPhillip Donta Fuster, MD  oxyCODONE-acetaminophen (ROXICET) 5-325 MG tablet Take 1 tablet by mouth every 6 (six) hours as needed  for severe pain. 06/23/16   Sharman Cheek, MD     Allergies Patient has no known allergies.   Family History  Problem Relation Age of Onset  . Arthritis Mother   . Osteoporosis Mother   . Coronary artery disease Father   . Diabetes Father   . Heart disease Father   . Meniere's disease Father   . Arthritis Sister   . Hypertension Sister   . Dementia Maternal Grandmother   . Heart attack Maternal Grandfather   . Breast cancer Paternal Grandmother 31  . Pneumonia Paternal Grandmother   . Alcohol abuse  Paternal Grandfather   . Pancreatitis Paternal Grandfather     Social History Social History  Substance Use Topics  . Smoking status: Former Games developer  . Smokeless tobacco: Never Used     Comment: Quit 1994  . Alcohol use 8.4 oz/week    14 Glasses of wine per week    Review of Systems  Constitutional:   No fever or chills.  ENT:   No sore throat. No rhinorrhea. Cardiovascular:   Positive as above chest pain. Respiratory:   No dyspnea or cough. Gastrointestinal:   Negative for abdominal pain, vomiting and diarrhea.  Genitourinary:   Negative for dysuria or difficulty urinating. Musculoskeletal:   Negative for focal pain or swelling Neurological:   Negative for headaches 10-point ROS otherwise negative.  ____________________________________________   PHYSICAL EXAM:  VITAL SIGNS: ED Triage Vitals  Enc Vitals Group     BP 06/23/16 0745 (!) 144/95     Pulse Rate 06/23/16 0745 88     Resp 06/23/16 0745 20     Temp 06/23/16 0745 99 F (37.2 C)     Temp Source 06/23/16 0745 Oral     SpO2 06/23/16 0745 97 %     Weight 06/23/16 0743 165 lb (74.8 kg)     Height 06/23/16 0743 5\' 1"  (1.549 m)     Head Circumference --      Peak Flow --      Pain Score 06/23/16 0741 9     Pain Loc --      Pain Edu? --      Excl. in GC? --     Vital signs reviewed, nursing assessments reviewed.   Constitutional:   Alert and oriented. Well appearing and in no distress. Eyes:   No scleral icterus. No conjunctival pallor. PERRL. EOMI.  No nystagmus. ENT   Head:   Normocephalic and atraumatic.   Nose:   No congestion/rhinnorhea. No septal hematoma   Mouth/Throat:   MMM, no pharyngeal erythema. No peritonsillar mass.    Neck:   No stridor. No SubQ emphysema. No meningismus. Hematological/Lymphatic/Immunilogical:   No cervical lymphadenopathy. Cardiovascular:   RRR. Symmetric bilateral radial and DP pulses.  No murmurs.  Respiratory:   Normal respiratory effort without tachypnea nor  retractions. Breath sounds are clear and equal bilaterally. No wheezes/rales/rhonchi.Chest wall nontender. Stable. No crepitus. Gastrointestinal:   Soft and nontender. Non distended. There is no CVA tenderness.  No rebound, rigidity, or guarding. Genitourinary:   deferred Musculoskeletal:   Normal range of motion in all extremities. No joint effusions.  No lower extremity tenderness.  No edema. Neurologic:   Normal speech and language.  CN 2-10 normal. Motor grossly intact. No gross focal neurologic deficits are appreciated.  Skin:    Skin is warm, dry and intact. No rash noted.  No petechiae, purpura, or bullae.  ____________________________________________    LABS (pertinent positives/negatives) (all labs ordered  are listed, but only abnormal results are displayed) Labs Reviewed - No data to display ____________________________________________   EKG  Interpreted by me  Date: 06/23/2016  Rate: 80  Rhythm: normal sinus rhythm  QRS Axis: normal  Intervals: normal  ST/T Wave abnormalities: normal  Conduction Disutrbances: none  Narrative Interpretation: unremarkable      ____________________________________________    RADIOLOGY  Dg Chest 2 View  Result Date: 06/23/2016 CLINICAL DATA:  Recent fall with right-sided rib pain, subsequent encounter EXAM: CHEST  2 VIEW COMPARISON:  06/21/2016 FINDINGS: Cardiac shadow is within normal limits. The lungs are well aerated bilaterally. No focal infiltrate or sizable effusion is seen. No pneumothorax is noted. No bony abnormality is seen. IMPRESSION: No active cardiopulmonary disease. Electronically Signed   By: Alcide Clever M.D.   On: 06/23/2016 08:16   Ct Chest Wo Contrast  Result Date: 06/23/2016 CLINICAL DATA:  Status post fall 06/21/2016 with onset of right chest pain. EXAM: CT CHEST WITHOUT CONTRAST TECHNIQUE: Multidetector CT imaging of the chest was performed following the standard protocol without IV contrast. COMPARISON:   Plain films of the right ribs 06/21/2016. PA and lateral chest earlier today. FINDINGS: Cardiovascular: No significant vascular findings. Normal heart size. No pericardial effusion. Mediastinum/Nodes: No enlarged mediastinal or axillary lymph nodes. Thyroid gland, trachea, and esophagus demonstrate no significant findings. Lungs/Pleura: Lungs are clear. No pleural effusion or pneumothorax. Upper Abdomen: No acute abnormality. Musculoskeletal: The patient has nondisplaced fractures of the right eighth and ninth ribs. Image bones otherwise appear normal. IMPRESSION: Nondisplaced right eighth and ninth rib fractures. The examination is otherwise normal. Negative for pneumothorax. Electronically Signed   By: Drusilla Kanner M.D.   On: 06/23/2016 09:13    ____________________________________________   PROCEDURES Procedures  ____________________________________________   INITIAL IMPRESSION / ASSESSMENT AND PLAN / ED COURSE  Pertinent labs & imaging results that were available during my care of the patient were reviewed by me and considered in my medical decision making (see chart for details).  Patient presents with right chest wall pain, pleuritic after a fall. Highly likely to be rib fracture. I did a chest x-ray and if negative a CT scan to evaluate for possible pneumothorax or hemothorax. No evidence of tension physiology or hemodynamic instability.. Considering the patient's symptoms, medical history, and physical examination today, I have low suspicion for ACS, PE, TAD, carditis, mediastinitis, pneumonia, CHF, or sepsis.       Clinical Course as of Jun 23 999  Wed Jun 23, 2016  0940 Ct = rib fx. IS, pain control.   [PS]    Clinical Course User Index [PS] Sharman Cheek, MD     ____________________________________________   FINAL CLINICAL IMPRESSION(S) / ED DIAGNOSES  Final diagnoses:  Closed traumatic nondisplaced fracture of two ribs of right side, initial encounter       New Prescriptions   NAPROXEN (NAPROSYN) 500 MG TABLET    Take 1 tablet (500 mg total) by mouth 2 (two) times daily with a meal.   ONDANSETRON (ZOFRAN ODT) 4 MG DISINTEGRATING TABLET    Take 1 tablet (4 mg total) by mouth every 8 (eight) hours as needed for nausea or vomiting.   OXYCODONE-ACETAMINOPHEN (ROXICET) 5-325 MG TABLET    Take 1 tablet by mouth every 6 (six) hours as needed for severe pain.     Portions of this note were generated with dragon dictation software. Dictation errors may occur despite best attempts at proofreading.    Sharman Cheek, MD 06/23/16 1007

## 2016-06-23 NOTE — ED Notes (Signed)
Pt discharged home after verbalizing understanding of discharge instructions; nad noted. 

## 2016-06-23 NOTE — ED Notes (Signed)
Educated pt regarding incentive spirometry; pt able to demonstrate procedure; she performed 3 rounds of spirometry, attaining 2250 each time.

## 2016-06-23 NOTE — Discharge Instructions (Signed)
Your CT scan shows nondisplaced fractures of your 8th and 9th ribs on the right side.

## 2016-06-23 NOTE — ED Triage Notes (Signed)
Pt presents to ED with c/o fall and R sided rib pain. Pt states she fell on Monday after tripping over a suit case. Pt states she initially had pain, X-ray did not show any fracture, but this morning woke up and was getting ready to go to work and had increasing pain. Pt states pain with position changes, and deep inspiration at this time.

## 2016-06-25 ENCOUNTER — Other Ambulatory Visit: Payer: Self-pay | Admitting: Physician Assistant

## 2016-06-25 DIAGNOSIS — J309 Allergic rhinitis, unspecified: Secondary | ICD-10-CM

## 2016-06-28 ENCOUNTER — Ambulatory Visit (INDEPENDENT_AMBULATORY_CARE_PROVIDER_SITE_OTHER): Payer: BLUE CROSS/BLUE SHIELD | Admitting: Physician Assistant

## 2016-06-28 ENCOUNTER — Encounter: Payer: Self-pay | Admitting: Physician Assistant

## 2016-06-28 VITALS — BP 110/80 | HR 80 | Temp 98.3°F | Resp 16 | Wt 169.8 lb

## 2016-06-28 DIAGNOSIS — S2241XA Multiple fractures of ribs, right side, initial encounter for closed fracture: Secondary | ICD-10-CM | POA: Diagnosis not present

## 2016-06-28 NOTE — Progress Notes (Signed)
Patient: Tonya Zuniga Female    DOB: 10-28-1965   50 y.o.   MRN: [id number] Visit Date: 06/28/2016  Today's Provider: Margaretann Loveless, PA-C   Chief Complaint  Patient presents with  . Follow-up   Subjective:    HPI  Follow up ER visit  Patient was seen in ER for right-sided rib pain from a fall on 06/23/2016. She was out of town and fell coming back from the bathroom in her hotel room and landed on the side of her suitcase. She did not seek treatment for a couple of days following the fall. She reports she sneezed and had a sharp pain in the right that would not subside thus her husband decided to take her to the ER for further evaluation. She was treated for nondisplaced fracture of two rids of right side seen on CT chest.  Treatment for this included naproxen, zofran, and oxycodone-acetaminophen. She is not taking the percocet or zofran. She reports excellent compliance with treatment. She reports this condition is Unchanged.  ------------------------------------------------------------------------------------     No Known Allergies   Current Outpatient Prescriptions:  .  buPROPion (WELLBUTRIN XL) 150 MG 24 hr tablet, TAKE 1 TABLET (150 MG TOTAL) BY MOUTH DAILY., Disp: 90 tablet, Rfl: 1 .  Calcium Polycarbophil (FIBER-CAPS PO), Take 1 capsule by mouth daily., Disp: , Rfl:  .  Calcium-Magnesium-Vitamin D (CALCIUM 500 PO), 1 tablet 2 (two) times daily as needed., Disp: , Rfl:  .  CLARITIN REDITABS 10 MG dissolvable tablet, TAKE 1 TABLET (10 MG TOTAL) BY MOUTH DAILY., Disp: 90 tablet, Rfl: 1 .  escitalopram (LEXAPRO) 10 MG tablet, TAKE 1 & 1/2 TABLETS BY MOUTH ONCE A DAY, Disp: 135 tablet, Rfl: 1 .  FLAXSEED, LINSEED, PO, Take 1 tablet by mouth daily., Disp: , Rfl:  .  Maca Root 500 MG CAPS, Take 1 capsule by mouth daily., Disp: , Rfl:  .  Melatonin 10 MG CAPS, Take 1 capsule by mouth at bedtime., Disp: , Rfl:  .  Methylsulfonylmethane 500 MG CAPS, Take 1 capsule by mouth  2 (two) times daily., Disp: , Rfl:  .  Misc Natural Products (TUMERSAID PO), Take 1,500 mg by mouth daily., Disp: , Rfl:  .  montelukast (SINGULAIR) 10 MG tablet, TAKE 1 TABLET (10 MG TOTAL) BY MOUTH DAILY., Disp: 90 tablet, Rfl: 1 .  MULTIPLE VITAMIN PO, Take 1 tablet by mouth every other day., Disp: , Rfl:  .  naproxen (NAPROSYN) 500 MG tablet, Take 1 tablet (500 mg total) by mouth 2 (two) times daily with a meal., Disp: 20 tablet, Rfl: 0 .  ondansetron (ZOFRAN ODT) 4 MG disintegrating tablet, Take 1 tablet (4 mg total) by mouth every 8 (eight) hours as needed for nausea or vomiting., Disp: 20 tablet, Rfl: 0 .  oxyCODONE-acetaminophen (ROXICET) 5-325 MG tablet, Take 1 tablet by mouth every 6 (six) hours as needed for severe pain. (Patient not taking: Reported on 06/28/2016), Disp: 20 tablet, Rfl: 0  Review of Systems  Constitutional: Negative.   Respiratory: Negative.   Cardiovascular: Positive for chest pain (on right side at fracture).  Musculoskeletal: Positive for myalgias.    Social History  Substance Use Topics  . Smoking status: Former Games developer  . Smokeless tobacco: Never Used     Comment: Quit 1994  . Alcohol use 8.4 oz/week    14 Glasses of wine per week   Objective:   BP 110/80 (BP Location: Right Arm, Patient Position: Sitting,  Cuff Size: Large)   Pulse 80   Temp 98.3 F (36.8 C) (Oral)   Resp 16   Wt 169 lb 12.8 oz (77 kg)   LMP 10/23/2014 (Exact Date) Comment: It lasted 9 days and it was very heavy. After not having it for 6 months.  SpO2 98%   BMI 32.08 kg/m  Vitals:   06/28/16 0941  BP: 110/80  Pulse: 80  Resp: 16  Temp: 98.3 F (36.8 C)  TempSrc: Oral  SpO2: 98%  Weight: 169 lb 12.8 oz (77 kg)     Physical Exam  Constitutional: She appears well-developed and well-nourished. No distress.  Neck: Normal range of motion. Neck supple.  Cardiovascular: Normal rate, regular rhythm and normal heart sounds.  Exam reveals no gallop and no friction rub.   No  murmur heard. Pulmonary/Chest: Effort normal and breath sounds normal. No respiratory distress. She has no wheezes. She has no rales. She exhibits tenderness (right posterior chest wall and right flank).  Skin: She is not diaphoretic.  Vitals reviewed.     Assessment & Plan:     1. Closed fracture of multiple ribs of right side, initial encounter Patient has been controlling pain with naproxen and tylenol. Using heating pad. Rest. Continue deep breathing exercises. She is to call if symptoms worsen, or if she develops SOB or worsening pain.        Margaretann Loveless, PA-C  [facility]

## 2016-06-28 NOTE — Patient Instructions (Signed)
Rib Fracture A rib fracture is a break or crack in one of the bones of the ribs. The ribs are a group of long, curved bones that wrap around your chest and attach to your spine. They protect your lungs and other organs in the chest cavity. A broken or cracked rib is often painful, but most do not cause other problems. Most rib fractures heal on their own over time. However, rib fractures can be more serious if multiple ribs are broken or if broken ribs move out of place and push against other structures. What are the causes?  A direct blow to the chest. For example, this could happen during contact sports, a car accident, or a fall against a hard object.  Repetitive movements with high force, such as pitching a baseball or having severe coughing spells. What are the signs or symptoms?  Pain when you breathe in or cough.  Pain when someone presses on the injured area. How is this diagnosed? Your caregiver will perform a physical exam. Various imaging tests may be ordered to confirm the diagnosis and to look for related injuries. These tests may include a chest X-ray, computed tomography (CT), magnetic resonance imaging (MRI), or a bone scan. How is this treated? Rib fractures usually heal on their own in 1-3 months. The longer healing period is often associated with a continued cough or other aggravating activities. During the healing period, pain control is very important. Medication is usually given to control pain. Hospitalization or surgery may be needed for more severe injuries, such as those in which multiple ribs are broken or the ribs have moved out of place. Follow these instructions at home:  Avoid strenuous activity and any activities or movements that cause pain. Be careful during activities and avoid bumping the injured rib.  Gradually increase activity as directed by your caregiver.  Only take over-the-counter or prescription medications as directed by your caregiver. Do not take  other medications without asking your caregiver first.  Apply ice to the injured area for the first 1-2 days after you have been treated or as directed by your caregiver. Applying ice helps to reduce inflammation and pain.  Put ice in a plastic bag.  Place a towel between your skin and the bag.  Leave the ice on for 15-20 minutes at a time, every 2 hours while you are awake.  Perform deep breathing as directed by your caregiver. This will help prevent pneumonia, which is a common complication of a broken rib. Your caregiver may instruct you to:  Take deep breaths several times a day.  Try to cough several times a day, holding a pillow against the injured area.  Use a device called an incentive spirometer to practice deep breathing several times a day.  Drink enough fluids to keep your urine clear or pale yellow. This will help you avoid constipation.  Do not wear a rib belt or binder. These restrict breathing, which can lead to pneumonia. Get help right away if:  You have a fever.  You have difficulty breathing or shortness of breath.  You develop a continual cough, or you cough up thick or bloody sputum.  You feel sick to your stomach (nausea), throw up (vomit), or have abdominal pain.  You have worsening pain not controlled with medications. This information is not intended to replace advice given to you by your health care provider. Make sure you discuss any questions you have with your health care provider. Document Released: 03/15/2005 Document   Revised: 08/21/2015 Document Reviewed: 05/17/2012 Elsevier Interactive Patient Education  2017 [employer]  

## 2016-07-20 ENCOUNTER — Telehealth: Payer: Self-pay

## 2016-07-20 NOTE — Telephone Encounter (Signed)
Patient advised about her labs results from [facility]. Per [facility] are WNL and Stable. Labs results copy placed up front ready to be scan.  Thanks,  -Jessaca Philippi

## 2016-07-21 ENCOUNTER — Encounter: Payer: Self-pay | Admitting: Physician Assistant

## 2016-10-28 ENCOUNTER — Other Ambulatory Visit: Payer: Self-pay | Admitting: Physician Assistant

## 2016-10-28 DIAGNOSIS — Z1231 Encounter for screening mammogram for malignant neoplasm of breast: Secondary | ICD-10-CM

## 2016-11-21 ENCOUNTER — Other Ambulatory Visit: Payer: Self-pay | Admitting: Physician Assistant

## 2016-11-21 DIAGNOSIS — F32A Depression, unspecified: Secondary | ICD-10-CM

## 2016-11-21 DIAGNOSIS — F329 Major depressive disorder, single episode, unspecified: Secondary | ICD-10-CM

## 2016-11-23 ENCOUNTER — Ambulatory Visit
Admission: RE | Admit: 2016-11-23 | Discharge: 2016-11-23 | Disposition: A | Discharge status: Home / Self Care | Payer: BLUE CROSS/BLUE SHIELD | Source: Ambulatory Visit | Attending: Physician Assistant | Admitting: Physician Assistant

## 2016-11-23 DIAGNOSIS — Z1231 Encounter for screening mammogram for malignant neoplasm of breast: Secondary | ICD-10-CM | POA: Diagnosis present

## 2016-12-03 ENCOUNTER — Other Ambulatory Visit: Payer: Self-pay | Admitting: Physician Assistant

## 2016-12-03 DIAGNOSIS — J309 Allergic rhinitis, unspecified: Secondary | ICD-10-CM

## 2016-12-22 ENCOUNTER — Encounter: Payer: Self-pay | Admitting: Physician Assistant

## 2016-12-22 ENCOUNTER — Ambulatory Visit (INDEPENDENT_AMBULATORY_CARE_PROVIDER_SITE_OTHER): Payer: BLUE CROSS/BLUE SHIELD | Admitting: Physician Assistant

## 2016-12-22 VITALS — BP 104/70 | HR 80 | Temp 98.8°F | Resp 16 | Ht 61.0 in | Wt 172.6 lb

## 2016-12-22 DIAGNOSIS — Z124 Encounter for screening for malignant neoplasm of cervix: Secondary | ICD-10-CM | POA: Diagnosis not present

## 2016-12-22 DIAGNOSIS — N951 Menopausal and female climacteric states: Secondary | ICD-10-CM | POA: Diagnosis not present

## 2016-12-22 DIAGNOSIS — G5603 Carpal tunnel syndrome, bilateral upper limbs: Secondary | ICD-10-CM | POA: Diagnosis not present

## 2016-12-22 DIAGNOSIS — Z0000 Encounter for general adult medical examination without abnormal findings: Secondary | ICD-10-CM | POA: Diagnosis not present

## 2016-12-22 DIAGNOSIS — Z1159 Encounter for screening for other viral diseases: Secondary | ICD-10-CM | POA: Diagnosis not present

## 2016-12-22 DIAGNOSIS — R7303 Prediabetes: Secondary | ICD-10-CM | POA: Diagnosis not present

## 2016-12-22 DIAGNOSIS — E041 Nontoxic single thyroid nodule: Secondary | ICD-10-CM

## 2016-12-22 NOTE — Progress Notes (Signed)
Patient: ALAINA DONATI, Female    DOB: 14-Aug-1965, 51 y.o.   MRN: [id number] Visit Date: 12/22/2016  Today's Provider: Margaretann Loveless, PA-C   Chief Complaint  Patient presents with  . Annual Exam   Subjective:    Annual physical exam LEATHIE WEICH is a 51 y.o. female who presents today for health maintenance and complete physical. She feels fairly well. She reports exercising none. She reports she is sleeping fairly well.  She reports that she still has diaphoresis every night, hot flashes are better. Patient also reports that she was placed on hormone therapy that January 2018 she bleed and started back with her Menstrual cycle and took herself off from the medications on Feb 2018. She hasn't had any bleeding since.   Last CPE:12/10/15 Pap:11/27/13 Normal,HPV-Neg Mammogram:11/23/16-BI-RADS 1 ----------------------------------------------------------------- Patient is also concern about some shooting pains, she gets on the right side of neck. She reports that she was evaluated several years ago by ENT and had an [place] that showed some nodules.She saw Dr. Willeen Cass in 2011-2012.  She is also awakening every morning with her hands numb. She reports numbness is present in the thumb, 2nd and 3rd fingers mostly. Occasionally will involve the entire hand. This has been progressing for a few months now. She does work on [employer] daily.   Per patient she gets the Influenza vaccine at work.  Review of Systems  Constitutional: Positive for diaphoresis and fatigue.  HENT: Positive for tinnitus.   Eyes: Negative.   Respiratory: Negative.   Cardiovascular: Negative.   Gastrointestinal: Negative.   Endocrine: Negative.   Genitourinary: Negative.   Musculoskeletal: Negative.   Skin: Negative.   Allergic/Immunologic: Negative.   Neurological: Positive for numbness ("hands").  Hematological: Negative.   Psychiatric/Behavioral: Positive for sleep disturbance.    Social History  She  reports that she has quit smoking. She has never used smokeless tobacco. She reports that she drinks about 8.4 oz of alcohol per week . She reports that she does not use drugs.       Social History   Social History  . Marital status: Married    Spouse name: N/A  . Number of children: N/A  . Years of education: N/A   Social History Main Topics  . Smoking status: Former Games developer  . Smokeless tobacco: Never Used     Comment: Quit 1994  . Alcohol use 8.4 oz/week    14 Glasses of wine per week  . Drug use: No  . Sexual activity: Not Asked   Other Topics Concern  . None   Social History Narrative  . None    No past medical history on file.   Patient Active Problem List   Diagnosis Date Noted  . Allergic rhinitis 12/27/2014  . Hot flash, menopausal 12/06/2014  . Arthritis 11/22/2014  . Airway hyperreactivity 11/22/2014  . Undiagnosed cardiac murmurs 11/22/2014  . Clinical depression 11/22/2014  . Family history of psychiatric condition 11/22/2014  . Esophageal reflux 11/22/2014  . Cannot sleep 11/22/2014  . Irregular bleeding 11/22/2014  . Decreased libido 11/22/2014  . Borderline diabetes 11/22/2014  . 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 Snores 11/22/2014  . Avitaminosis D 11/22/2014  . Adiposity 09/11/2009  . Nontoxic uninodular goiter 07/19/2008  . Anxiety disorder 03/08/2003    Past Surgical History:  Procedure Laterality Date  . APPENDECTOMY  1981  . KNEE SURGERY Right 01/05/2011    Family History  Family Status  Relation Status  . Mother Alive  . Father Alive  . Sister Alive  . MGM Deceased at age 69  . MGF Deceased at age 105  . PGM Deceased at age 47  . PGF Deceased at age 48        Her family history includes Alcohol abuse in her paternal grandfather; Arthritis in her mother and sister; Breast cancer (age of onset: 48) in her paternal grandmother; Coronary artery disease in her father; Dementia in her maternal grandmother; Diabetes in her  father; Heart attack in her maternal grandfather; Heart disease in her father; Hypertension in her sister; Meniere's disease in her father; Osteoporosis in her mother; Pancreatitis in her paternal grandfather; Pneumonia in her paternal grandmother.     No Known Allergies   Current Outpatient Prescriptions:  .  buPROPion (WELLBUTRIN XL) 150 MG 24 hr tablet, TAKE 1 TABLET (150 MG TOTAL) BY MOUTH DAILY., Disp: 90 tablet, Rfl: 1 .  Calcium Polycarbophil (FIBER-CAPS PO), Take 1 capsule by mouth daily., Disp: , Rfl:  .  Calcium-Magnesium-Vitamin D (CALCIUM 500 PO), 1 tablet 2 (two) times daily as needed., Disp: , Rfl:  .  CLARITIN REDITABS 10 MG dissolvable tablet, TAKE 1 TABLET (10 MG TOTAL) BY MOUTH DAILY., Disp: 90 tablet, Rfl: 1 .  escitalopram (LEXAPRO) 10 MG tablet, TAKE 1 & 1/2 TABLETS BY MOUTH ONCE A DAY, Disp: 135 tablet, Rfl: 1 .  FLAXSEED, LINSEED, PO, Take 1 tablet by mouth daily., Disp: , Rfl:  .  Maca Root 500 MG CAPS, Take 1 capsule by mouth daily., Disp: , Rfl:  .  Melatonin 10 MG CAPS, Take 1 capsule by mouth at bedtime., Disp: , Rfl:  .  Methylsulfonylmethane 500 MG CAPS, Take 1 capsule by mouth 2 (two) times daily., Disp: , Rfl:  .  Misc Natural Products (TUMERSAID PO), Take 1,500 mg by mouth daily., Disp: , Rfl:  .  montelukast (SINGULAIR) 10 MG tablet, TAKE 1 TABLET (10 MG TOTAL) BY MOUTH DAILY., Disp: 90 tablet, Rfl: 1 .  MULTIPLE VITAMIN PO, Take 1 tablet by mouth every other day., Disp: , Rfl:  .  naproxen (NAPROSYN) 500 MG tablet, Take 1 tablet (500 mg total) by mouth 2 (two) times daily with a meal., Disp: 20 tablet, Rfl: 0 .  ondansetron (ZOFRAN ODT) 4 MG disintegrating tablet, Take 1 tablet (4 mg total) by mouth every 8 (eight) hours as needed for nausea or vomiting., Disp: 20 tablet, Rfl: 0 .  oxyCODONE-acetaminophen (ROXICET) 5-325 MG tablet, Take 1 tablet by mouth every 6 (six) hours as needed for severe pain. (Patient not taking: Reported on 06/28/2016), Disp: 20 tablet,  Rfl: 0   Patient Care Team: Margaretann Loveless, PA-C as PCP - General (Family Medicine)      Objective:   Vitals: BP 104/70 (BP Location: Left Arm, Patient Position: Sitting, Cuff Size: Normal)   Pulse 80   Temp 98.8 F (37.1 C) (Oral)   Resp 16   Ht  (1.549 m)   Wt 172 lb 9.6 oz (78.3 kg)   LMP 03/29/2016 Comment: It lasted 9 days and it was very heavy. After not having it for 6 months.  SpO2 98%   BMI 32.61 kg/m    Vitals:   12/22/16 1437  BP: 104/70  Pulse: 80  Resp: 16  Temp: 98.8 F (37.1 C)  TempSrc: Oral  SpO2: 98%  Weight: 172 lb 9.6 oz (78.3 kg)  Height:  (1.549 m)  Physical Exam  Constitutional: She is oriented to person, place, and time. She appears well-developed and well-nourished. No distress.  HENT:  Head: Normocephalic and atraumatic.  Right Ear: Hearing, tympanic membrane, external ear and ear canal normal.  Left Ear: Hearing, tympanic membrane, external ear and ear canal normal.  Nose: Nose normal.  Mouth/Throat: Uvula is midline, oropharynx is clear and moist and mucous membranes are normal. No oropharyngeal exudate.  Eyes: Pupils are equal, round, and reactive to light. Conjunctivae and EOM are normal. Right eye exhibits no discharge. Left eye exhibits no discharge. No scleral icterus.  Neck: Normal range of motion. Neck supple. No JVD present. Carotid bruit is not present. No tracheal deviation present. No thyromegaly present.  Cardiovascular: Normal rate, regular rhythm, normal heart sounds and intact distal pulses.  Exam reveals no gallop and no friction rub.   No murmur heard. Pulmonary/Chest: Effort normal and breath sounds normal. No respiratory distress. She has no wheezes. She has no rales. She exhibits no tenderness. Right breast exhibits no inverted nipple, no mass, no nipple discharge, no skin change and no tenderness. Left breast exhibits no inverted nipple, no mass, no nipple discharge, no skin change and no tenderness.  Breasts are symmetrical.  Abdominal: Soft. Bowel sounds are normal. She exhibits no distension and no mass. There is no tenderness. There is no rebound and no guarding. Hernia confirmed negative in the right inguinal area and confirmed negative in the left inguinal area.  Genitourinary: Rectum normal, vagina normal and uterus normal. No breast swelling, tenderness, discharge or bleeding. Pelvic exam was performed with patient supine. There is no rash, tenderness, lesion or injury on the right labia. There is no rash, tenderness, lesion or injury on the left labia. Cervix exhibits no motion tenderness, no discharge and no friability. Right adnexum displays no mass, no tenderness and no fullness. Left adnexum displays no mass, no tenderness and no fullness. No erythema, tenderness or bleeding in the vagina. No signs of injury around the vagina. No vaginal discharge found.  Musculoskeletal: Normal range of motion. She exhibits no edema or tenderness.  Lymphadenopathy:    She has no cervical adenopathy.       Right: No inguinal adenopathy present.       Left: No inguinal adenopathy present.  Neurological: She is alert and oriented to person, place, and time. She has normal reflexes. No cranial nerve deficit. Coordination normal.  Skin: Skin is warm and dry. No rash noted. She is not diaphoretic.  Psychiatric: She has a normal mood and affect. Her behavior is normal. Judgment and thought content normal.  Vitals reviewed.    Depression Screen PHQ 2/9 Scores 12/22/2016  PHQ - 2 Score 0  PHQ- 9 Score 5      Assessment & Plan:     Routine Health Maintenance and Physical Exam  Exercise Activities and Dietary recommendations Goals    None      Immunization History  Administered Date(s) Administered  . Influenza,inj,Quad PF,6+ Mos 12/06/2014  . Pneumococcal Polysaccharide-23 11/23/2012  . Td 09/10/2004  . Tdap 12/27/2009    Health Maintenance  Topic Date Due  . Hepatitis C Screening   08-May-1965  . HIV Screening  06/30/1978  . INFLUENZA VACCINE  10/27/2016  . PAP SMEAR  11/27/2016  . MAMMOGRAM  11/24/2018  . TETANUS/TDAP  12/28/2019  . COLONOSCOPY  03/04/2024     Discussed health benefits of physical activity, and encouraged her to engage in regular exercise appropriate for her age and  condition.    1. Annual physical exam Normal physical exam today. Labs are checked by her employer during their birthday month. Hers were checked in April of this year and are in Epic. All labs were normal. She is to call the office in the meantime if she has any acute issue, questions or concerns.  2. Borderline diabetes A1c had been WNL at last check at 5.4.  3. Nontoxic uninodular goiter Since she is starting to have the shooting neck pain again I will order [place] to make sure thyroid nodules have not changed in size. If nodules have changed will refer back to Dr. Willeen Cass. If unchanged will consider cervical spine xray. DDx: thyroid nodule, SCM inflammation, TMJ, cervical DDD. - US Soft Tissue Head/Neck; Future  4. Hot flash, menopausal Daytime hot flashes have improved. Still has night sweats. Discussed different treatment options including switching her lexapro to paroxetine or venlafaxine to see if improvements noted as well as using black cohosh. She is going to continue to try conservative management with sleeping in light clothing, using only a sheet to cover and having a fan on her at this time. She may consider black cohosh. If she does she knows she is to call the office to let me know so that we can monitor her LFTs while taking that supplement.   5. Cervical cancer screening Pap collected today. Will send as below and f/u pending results. - Pap IG and HPV (high risk) DNA detection  6. Bilateral carpal tunnel syndrome Discussed conservative management with night splinting and using NSAID prn. If this does not help then she is to call the office and will refer to orthopedics.     7. Need for hepatitis C screening test Will try to get at her lab draw next year or may add on sooner if we need to check LFTs with black cohosh.   --------------------------------------------------------------------    Margaretann Loveless, PA-C  [facility]

## 2016-12-22 NOTE — Patient Instructions (Signed)
Health Maintenance for Postmenopausal Women Menopause is a normal process in which your reproductive ability comes to an end. This process happens gradually over a span of months to years, usually between the ages of 22 and 9. Menopause is complete when you have missed 12 consecutive menstrual periods. It is important to talk with your health care provider about some of the most common conditions that affect postmenopausal women, such as heart disease, cancer, and bone loss (osteoporosis). Adopting a healthy lifestyle and getting preventive care can help to promote your health and wellness. Those actions can also lower your chances of developing some of these common conditions. What should I know about menopause? During menopause, you may experience a number of symptoms, such as:  Moderate-to-severe hot flashes.  Night sweats.  Decrease in sex drive.  Mood swings.  Headaches.  Tiredness.  Irritability.  Memory problems.  Insomnia.  Choosing to treat or not to treat menopausal changes is an individual decision that you make with your health care provider. What should I know about hormone replacement therapy and supplements? Hormone therapy products are effective for treating symptoms that are associated with menopause, such as hot flashes and night sweats. Hormone replacement carries certain risks, especially as you become older. If you are thinking about using estrogen or estrogen with progestin treatments, discuss the benefits and risks with your health care provider. What should I know about heart disease and stroke? Heart disease, heart attack, and stroke become more likely as you age. This may be due, in part, to the hormonal changes that your body experiences during menopause. These can affect how your body processes dietary fats, triglycerides, and cholesterol. Heart attack and stroke are both medical emergencies. There are many things that you can do to help prevent heart disease  and stroke:  Have your blood pressure checked at least every 1-2 years. High blood pressure causes heart disease and increases the risk of stroke.  If you are 53-22 years old, ask your health care provider if you should take aspirin to prevent a heart attack or a stroke.  Do not use any tobacco products, including cigarettes, chewing tobacco, or electronic cigarettes. If you need help quitting, ask your health care provider.  It is important to eat a healthy diet and maintain a healthy weight. ? Be sure to include plenty of vegetables, fruits, low-fat dairy products, and lean protein. ? Avoid eating foods that are high in solid fats, added sugars, or salt (sodium).  Get regular exercise. This is one of the most important things that you can do for your health. ? Try to exercise for at least 150 minutes each week. The type of exercise that you do should increase your heart rate and make you sweat. This is known as moderate-intensity exercise. ? Try to do strengthening exercises at least twice each week. Do these in addition to the moderate-intensity exercise.  Know your numbers.Ask your health care provider to check your cholesterol and your blood glucose. Continue to have your blood tested as directed by your health care provider.  What should I know about cancer screening? There are several types of cancer. Take the following steps to reduce your risk and to catch any cancer development as early as possible. Breast Cancer  Practice breast self-awareness. ? This means understanding how your breasts normally appear and feel. ? It also means doing regular breast self-exams. Let your health care provider know about any changes, no matter how small.  If you are 40  or older, have a clinician do a breast exam (clinical breast exam or CBE) every year. Depending on your age, family history, and medical history, it may be recommended that you also have a yearly breast X-ray (mammogram).  If you  have a family history of breast cancer, talk with your health care provider about genetic screening.  If you are at high risk for breast cancer, talk with your health care provider about having an MRI and a mammogram every year.  Breast cancer (BRCA) gene test is recommended for women who have family members with BRCA-related cancers. Results of the assessment will determine the need for genetic counseling and BRCA1 and for BRCA2 testing. BRCA-related cancers include these types: ? Breast. This occurs in males or females. ? Ovarian. ? Tubal. This may also be called fallopian tube cancer. ? Cancer of the abdominal or pelvic lining (peritoneal cancer). ? Prostate. ? Pancreatic.  Cervical, Uterine, and Ovarian Cancer Your health care provider may recommend that you be screened regularly for cancer of the pelvic organs. These include your ovaries, uterus, and vagina. This screening involves a pelvic exam, which includes checking for microscopic changes to the surface of your cervix (Pap test).  For women ages 21-65, health care providers may recommend a pelvic exam and a Pap test every three years. For women ages 79-65, they may recommend the Pap test and pelvic exam, combined with testing for human papilloma virus (HPV), every five years. Some types of HPV increase your risk of cervical cancer. Testing for HPV may also be done on women of any age who have unclear Pap test results.  Other health care providers may not recommend any screening for nonpregnant women who are considered low risk for pelvic cancer and have no symptoms. Ask your health care provider if a screening pelvic exam is right for you.  If you have had past treatment for cervical cancer or a condition that could lead to cancer, you need Pap tests and screening for cancer for at least 20 years after your treatment. If Pap tests have been discontinued for you, your risk factors (such as having a new sexual partner) need to be  reassessed to determine if you should start having screenings again. Some women have medical problems that increase the chance of getting cervical cancer. In these cases, your health care provider may recommend that you have screening and Pap tests more often.  If you have a family history of uterine cancer or ovarian cancer, talk with your health care provider about genetic screening.  If you have vaginal bleeding after reaching menopause, tell your health care provider.  There are currently no reliable tests available to screen for ovarian cancer.  Lung Cancer Lung cancer screening is recommended for adults 69-62 years old who are at high risk for lung cancer because of a history of smoking. A yearly low-dose CT scan of the lungs is recommended if you:  Currently smoke.  Have a history of at least 30 pack-years of smoking and you currently smoke or have quit within the past 15 years. A pack-year is smoking an average of one pack of cigarettes per day for one year.  Yearly screening should:  Continue until it has been 15 years since you quit.  Stop if you develop a health problem that would prevent you from having lung cancer treatment.  Colorectal Cancer  This type of cancer can be detected and can often be prevented.  Routine colorectal cancer screening usually begins at  age 42 and continues through age 45.  If you have risk factors for colon cancer, your health care provider may recommend that you be screened at an earlier age.  If you have a family history of colorectal cancer, talk with your health care provider about genetic screening.  Your health care provider may also recommend using home test kits to check for hidden blood in your stool.  A small camera at the end of a tube can be used to examine your colon directly (sigmoidoscopy or colonoscopy). This is done to check for the earliest forms of colorectal cancer.  Direct examination of the colon should be repeated every  5-10 years until age 71. However, if early forms of precancerous polyps or small growths are found or if you have a family history or genetic risk for colorectal cancer, you may need to be screened more often.  Skin Cancer  Check your skin from head to toe regularly.  Monitor any moles. Be sure to tell your health care provider: ? About any new moles or changes in moles, especially if there is a change in a mole's shape or color. ? If you have a mole that is larger than the size of a pencil eraser.  If any of your family members has a history of skin cancer, especially at a young age, talk with your health care provider about genetic screening.  Always use sunscreen. Apply sunscreen liberally and repeatedly throughout the day.  Whenever you are outside, protect yourself by wearing long sleeves, pants, a wide-brimmed hat, and sunglasses.  What should I know about osteoporosis? Osteoporosis is a condition in which bone destruction happens more quickly than new bone creation. After menopause, you may be at an increased risk for osteoporosis. To help prevent osteoporosis or the bone fractures that can happen because of osteoporosis, the following is recommended:  If you are 46-71 years old, get at least 1,000 mg of calcium and at least 600 mg of vitamin D per day.  If you are older than age 55 but younger than age 65, get at least 1,200 mg of calcium and at least 600 mg of vitamin D per day.  If you are older than age 54, get at least 1,200 mg of calcium and at least 800 mg of vitamin D per day.  Smoking and excessive alcohol intake increase the risk of osteoporosis. Eat foods that are rich in calcium and vitamin D, and do weight-bearing exercises several times each week as directed by your health care provider. What should I know about how menopause affects my mental health? Depression may occur at any age, but it is more common as you become older. Common symptoms of depression  include:  Low or sad mood.  Changes in sleep patterns.  Changes in appetite or eating patterns.  Feeling an overall lack of motivation or enjoyment of activities that you previously enjoyed.  Frequent crying spells.  Talk with your health care provider if you think that you are experiencing depression. What should I know about immunizations? It is important that you get and maintain your immunizations. These include:  Tetanus, diphtheria, and pertussis (Tdap) booster vaccine.  Influenza every year before the flu season begins.  Pneumonia vaccine.  Shingles vaccine.  Your health care provider may also recommend other immunizations. This information is not intended to replace advice given to you by your health care provider. Make sure you discuss any questions you have with your health care provider. Document Released: 05/07/2005  Document Revised: 10/03/2015 Document Reviewed: 12/17/2014 Elsevier Interactive Patient Education  2018 [employer]  

## 2016-12-23 ENCOUNTER — Other Ambulatory Visit: Payer: Self-pay | Admitting: Physician Assistant

## 2016-12-23 DIAGNOSIS — J309 Allergic rhinitis, unspecified: Secondary | ICD-10-CM

## 2016-12-23 LAB — PAP IG AND HPV HIGH-RISK: HPV DNA HIGH RISK: NOT DETECTED

## 2016-12-24 ENCOUNTER — Telehealth: Payer: Self-pay

## 2016-12-24 NOTE — Telephone Encounter (Signed)
Patient advised.KW 

## 2016-12-24 NOTE — Telephone Encounter (Signed)
-----   Message from Margaretann Loveless, PA-C sent at 12/23/2016  5:33 PM EDT ----- Pap is normal, HPV negative.  Will repeat in 3-5 years.

## 2016-12-27 ENCOUNTER — Ambulatory Visit
Admission: RE | Admit: 2016-12-27 | Discharge: 2016-12-27 | Disposition: A | Discharge status: Home / Self Care | Payer: BLUE CROSS/BLUE SHIELD | Source: Ambulatory Visit | Attending: Physician Assistant | Admitting: Physician Assistant

## 2016-12-27 DIAGNOSIS — E042 Nontoxic multinodular goiter: Secondary | ICD-10-CM | POA: Diagnosis not present

## 2016-12-27 DIAGNOSIS — E041 Nontoxic single thyroid nodule: Secondary | ICD-10-CM

## 2017-04-08 ENCOUNTER — Other Ambulatory Visit: Payer: Self-pay

## 2017-04-08 ENCOUNTER — Emergency Department: Payer: BLUE CROSS/BLUE SHIELD

## 2017-04-08 ENCOUNTER — Inpatient Hospital Stay
Admission: EM | Admit: 2017-04-08 | Discharge: 2017-04-09 | DRG: 193 | Disposition: A | Discharge status: Home / Self Care | Payer: BLUE CROSS/BLUE SHIELD | Attending: Specialist | Admitting: Specialist

## 2017-04-08 DIAGNOSIS — R0602 Shortness of breath: Secondary | ICD-10-CM

## 2017-04-08 DIAGNOSIS — J9601 Acute respiratory failure with hypoxia: Secondary | ICD-10-CM

## 2017-04-08 DIAGNOSIS — Z79899 Other long term (current) drug therapy: Secondary | ICD-10-CM

## 2017-04-08 DIAGNOSIS — J181 Lobar pneumonia, unspecified organism: Secondary | ICD-10-CM | POA: Diagnosis present

## 2017-04-08 DIAGNOSIS — M810 Age-related osteoporosis without current pathological fracture: Secondary | ICD-10-CM | POA: Diagnosis present

## 2017-04-08 DIAGNOSIS — F329 Major depressive disorder, single episode, unspecified: Secondary | ICD-10-CM | POA: Diagnosis present

## 2017-04-08 DIAGNOSIS — J189 Pneumonia, unspecified organism: Secondary | ICD-10-CM

## 2017-04-08 DIAGNOSIS — Z87891 Personal history of nicotine dependence: Secondary | ICD-10-CM | POA: Diagnosis not present

## 2017-04-08 DIAGNOSIS — R0902 Hypoxemia: Secondary | ICD-10-CM

## 2017-04-08 DIAGNOSIS — R0789 Other chest pain: Secondary | ICD-10-CM

## 2017-04-08 DIAGNOSIS — J159 Unspecified bacterial pneumonia: Secondary | ICD-10-CM | POA: Diagnosis present

## 2017-04-08 LAB — COMPREHENSIVE METABOLIC PANEL
ALBUMIN: 4.2 g/dL (ref 3.5–5.0)
ALK PHOS: 84 U/L (ref 38–126)
ALT: 26 U/L (ref 14–54)
ANION GAP: 14 (ref 5–15)
AST: 29 U/L (ref 15–41)
BUN: 19 mg/dL (ref 6–20)
CALCIUM: 9.2 mg/dL (ref 8.9–10.3)
CO2: 24 mmol/L (ref 22–32)
Chloride: 107 mmol/L (ref 101–111)
Creatinine, Ser: 0.75 mg/dL (ref 0.44–1.00)
GFR calc Af Amer: >60 mL/min (ref >60)
GFR calc non Af Amer: >60 mL/min (ref >60)
GLUCOSE: 156 mg/dL — AB (ref 65–99)
Potassium: 3.8 mmol/L (ref 3.5–5.1)
Sodium: 145 mmol/L (ref 135–145)
Total Bilirubin: 0.5 mg/dL (ref 0.3–1.2)
Total Protein: 7 g/dL (ref 6.5–8.1)

## 2017-04-08 LAB — CBC WITH DIFFERENTIAL/PLATELET
Basophils Absolute: 0.1 10*3/uL (ref 0–0.1)
Basophils Relative: 1 %
Eosinophils Absolute: 0.1 10*3/uL (ref 0–0.7)
Eosinophils Relative: 1 %
HEMATOCRIT: 42.1 % (ref 35.0–47.0)
HEMOGLOBIN: 14.3 g/dL (ref 12.0–16.0)
LYMPHS ABS: 5.1 10*3/uL — AB (ref 1.0–3.6)
LYMPHS PCT: 42 %
MCH: 33.3 pg (ref 26.0–34.0)
MCHC: 34.1 g/dL (ref 32.0–36.0)
MCV: 97.7 fL (ref 80.0–100.0)
Monocytes Absolute: 0.5 10*3/uL (ref 0.2–0.9)
Monocytes Relative: 4 %
NEUTROS ABS: 6.3 10*3/uL (ref 1.4–6.5)
NEUTROS PCT: 52 %
PLATELETS: 333 10*3/uL (ref 150–440)
RBC: 4.31 MIL/uL (ref 3.80–5.20)
RDW: 13.1 % (ref 11.5–14.5)
WBC: 12.1 10*3/uL — ABNORMAL HIGH (ref 3.6–11.0)

## 2017-04-08 LAB — PROTIME-INR
INR: 0.86
Prothrombin Time: 11.6 seconds (ref 11.4–15.2)

## 2017-04-08 LAB — TROPONIN I: Troponin I: <0.03 ng/mL (ref <0.03)

## 2017-04-08 LAB — PROCALCITONIN

## 2017-04-08 LAB — LACTIC ACID, PLASMA: Lactic Acid, Venous: 1.2 mmol/L (ref 0.5–1.9)

## 2017-04-08 MED ORDER — AZITHROMYCIN 500 MG PO TABS
250.0000 mg | ORAL_TABLET | Freq: Every day | ORAL | Status: DC
  Administered 2017-04-08 – 2017-04-09 (×2): 250 mg via ORAL
  Filled 2017-04-08 (×2): qty 1

## 2017-04-08 MED ORDER — ACETAMINOPHEN 650 MG RE SUPP: 650.0000 mg | Freq: Four times a day (QID) | RECTAL | Status: DC | PRN

## 2017-04-08 MED ORDER — LIDOCAINE HCL (PF) 1 % IJ SOLN
INTRAMUSCULAR | Status: AC
  Filled 2017-04-08: qty 10

## 2017-04-08 MED ORDER — BUPROPION HCL ER (XL) 150 MG PO TB24
150.0000 mg | ORAL_TABLET | Freq: Every day | ORAL | Status: DC
  Administered 2017-04-08 – 2017-04-09 (×2): 150 mg via ORAL
  Filled 2017-04-08 (×2): qty 1

## 2017-04-08 MED ORDER — IOPAMIDOL (ISOVUE-370) INJECTION 76%
75.0000 mL | Freq: Once | INTRAVENOUS | Status: AC | PRN
  Administered 2017-04-08: 75 mL via INTRAVENOUS

## 2017-04-08 MED ORDER — MORPHINE SULFATE (PF) 4 MG/ML IV SOLN
4.0000 mg | Freq: Once | INTRAVENOUS | Status: AC
  Administered 2017-04-08: 4 mg via INTRAVENOUS
  Filled 2017-04-08: qty 1

## 2017-04-08 MED ORDER — ESCITALOPRAM OXALATE 5 MG PO TABS
15.0000 mg | ORAL_TABLET | Freq: Every day | ORAL | Status: DC
  Filled 2017-04-08: qty 1
  Filled 2017-04-08: qty 2

## 2017-04-08 MED ORDER — MELATONIN 5 MG PO TABS
5.0000 mg | ORAL_TABLET | Freq: Every day | ORAL | Status: DC
  Administered 2017-04-08: 5 mg via ORAL
  Filled 2017-04-08 (×2): qty 1

## 2017-04-08 MED ORDER — LIDOCAINE HCL (PF) 1 % IJ SOLN: 10.0000 mL | Freq: Once | INTRAMUSCULAR | Status: DC

## 2017-04-08 MED ORDER — CEFTRIAXONE SODIUM IN DEXTROSE 20 MG/ML IV SOLN
1.0000 g | Freq: Once | INTRAVENOUS | Status: AC
  Administered 2017-04-08: 1 g via INTRAVENOUS
  Filled 2017-04-08: qty 50

## 2017-04-08 MED ORDER — TRAMADOL HCL 50 MG PO TABS
50.0000 mg | ORAL_TABLET | Freq: Four times a day (QID) | ORAL | Status: DC | PRN
  Administered 2017-04-09: 50 mg via ORAL
  Filled 2017-04-08: qty 1

## 2017-04-08 MED ORDER — DOCUSATE SODIUM 100 MG PO CAPS
100.0000 mg | ORAL_CAPSULE | Freq: Two times a day (BID) | ORAL | Status: DC
  Administered 2017-04-08 – 2017-04-09 (×3): 100 mg via ORAL
  Filled 2017-04-08 (×3): qty 1

## 2017-04-08 MED ORDER — CEFTRIAXONE SODIUM IN DEXTROSE 20 MG/ML IV SOLN
1.0000 g | INTRAVENOUS | Status: DC
  Filled 2017-04-08: qty 50

## 2017-04-08 MED ORDER — ONDANSETRON HCL 4 MG/2ML IJ SOLN: 4.0000 mg | Freq: Four times a day (QID) | INTRAMUSCULAR | Status: DC | PRN

## 2017-04-08 MED ORDER — SODIUM CHLORIDE 0.9 % IV BOLUS (SEPSIS)
1000.0000 mL | Freq: Once | INTRAVENOUS | Status: AC
  Administered 2017-04-08: 1000 mL via INTRAVENOUS

## 2017-04-08 MED ORDER — ADULT MULTIVITAMIN W/MINERALS CH
1.0000 | ORAL_TABLET | ORAL | Status: DC
  Administered 2017-04-08: 1 via ORAL
  Filled 2017-04-08: qty 1

## 2017-04-08 MED ORDER — ENOXAPARIN SODIUM 40 MG/0.4ML ~~LOC~~ SOLN
40.0000 mg | SUBCUTANEOUS | Status: DC
  Administered 2017-04-08 – 2017-04-09 (×2): 40 mg via SUBCUTANEOUS
  Filled 2017-04-08 (×2): qty 0.4

## 2017-04-08 MED ORDER — ONDANSETRON HCL 4 MG PO TABS: 4.0000 mg | ORAL_TABLET | Freq: Four times a day (QID) | ORAL | Status: DC | PRN

## 2017-04-08 MED ORDER — ALBUTEROL SULFATE (2.5 MG/3ML) 0.083% IN NEBU: 2.5000 mg | INHALATION_SOLUTION | Freq: Four times a day (QID) | RESPIRATORY_TRACT | Status: DC | PRN

## 2017-04-08 MED ORDER — DEXTROSE 5 % IV SOLN
500.0000 mg | Freq: Once | INTRAVENOUS | Status: AC
  Administered 2017-04-08: 500 mg via INTRAVENOUS
  Filled 2017-04-08: qty 500

## 2017-04-08 MED ORDER — ONDANSETRON HCL 4 MG/2ML IJ SOLN
4.0000 mg | INTRAMUSCULAR | Status: AC
  Administered 2017-04-08: 4 mg via INTRAVENOUS
  Filled 2017-04-08: qty 2

## 2017-04-08 MED ORDER — DEXTROSE 5 % IV SOLN
1.0000 g | INTRAVENOUS | Status: DC
  Administered 2017-04-09: 1 g via INTRAVENOUS
  Filled 2017-04-08 (×2): qty 10

## 2017-04-08 MED ORDER — ACETAMINOPHEN 325 MG PO TABS
650.0000 mg | ORAL_TABLET | Freq: Four times a day (QID) | ORAL | Status: DC | PRN
  Administered 2017-04-08: 650 mg via ORAL
  Filled 2017-04-08: qty 2

## 2017-04-08 MED ORDER — ESCITALOPRAM OXALATE 10 MG PO TABS
15.0000 mg | ORAL_TABLET | Freq: Every day | ORAL | Status: DC
  Administered 2017-04-08: 15 mg via ORAL
  Filled 2017-04-08 (×2): qty 1.5

## 2017-04-08 MED ORDER — POLYETHYLENE GLYCOL 3350 17 G PO PACK: 17.0000 g | PACK | Freq: Every day | ORAL | Status: DC | PRN

## 2017-04-08 NOTE — ED Triage Notes (Signed)
Pt arrives via POV with complaints of pain in chest, Left rib cage and radiating into neck and shoulder. Pain increases with deep inhalation. Pt unable to get comfortable, is shallow breathing. On arrival pt O2 88-90. Pt placed on 2L O2. Pt moaning and moving around in bed. O2 sat 93 on 2L

## 2017-04-08 NOTE — Plan of Care (Signed)
  Progressing Health Behavior/Discharge Planning: Ability to manage health-related needs will improve 04/08/2017 1646 - Progressing by Luretha MurphyMiles, Lamont Tant, RN Note Fever today tylenol given and temp came down. Antibiotics in use

## 2017-04-08 NOTE — ED Provider Notes (Signed)
[facility] Emergency Department Provider Note  ____________________________________________   First MD Initiated Contact with Patient 04/08/17 [id number]     (approximate)  I have reviewed the triage vital signs and the nursing notes.   HISTORY  Chief Complaint Chest Pain   Level 5 caveat:  history/ROS limited by acute/critical illness   HPI Tonya Zuniga is a 52 y.o. female with medical history as listed below who presents for evaluation of acute onset chest pain and shortness of breath that occurred during a coughing episode.  She reports that she has had a cough recently and was coughing violently tonight when she had a sudden sharp pain in the left side of her chest and then felt acutely short of breath.  She states it feels like a lung popped.  She has trouble catching her breath and severe pain with inspiration.  She arrived by private vehicle and when her oxygenation was checked she had an SPO2 of 88% on room air.  On 2 L she is 92-93% and still in severe pain, much worse with deep inspiration.  History is limited due to her shortness of breath and severe pain, but she is able to state that other than the cough she has been in her normal state of health recently.  She has no cardiac history and was not having chest pain prior to the coughing episode.  History reviewed. No pertinent past medical history.  Patient Active Problem List   Diagnosis Date Noted  . Allergic rhinitis 12/27/2014  . Hot flash, menopausal 12/06/2014  . Arthritis 11/22/2014  . Airway hyperreactivity 11/22/2014  . Undiagnosed cardiac murmurs 11/22/2014  . Clinical depression 11/22/2014  . Family history of psychiatric condition 11/22/2014  . Esophageal reflux 11/22/2014  . Cannot sleep 11/22/2014  . Irregular bleeding 11/22/2014  . Decreased libido 11/22/2014  . Borderline diabetes 11/22/2014  . 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 Snores 11/22/2014  . Avitaminosis D 11/22/2014  .  Adiposity 09/11/2009  . Nontoxic uninodular goiter 07/19/2008  . Anxiety disorder 03/08/2003    Past Surgical History:  Procedure Laterality Date  . APPENDECTOMY  1981  . KNEE SURGERY Right 01/05/2011    Prior to Admission medications   Medication Sig Start Date End Date Taking? Authorizing Provider  buPROPion (WELLBUTRIN XL) 150 MG 24 hr tablet TAKE 1 TABLET (150 MG TOTAL) BY MOUTH DAILY. 11/22/16   Margaretann LovelessBurnette, Jennifer M, PA-C  Calcium Polycarbophil (FIBER-CAPS PO) Take 1 capsule by mouth daily.    [provider]  Calcium-Magnesium-Vitamin D (CALCIUM 500 PO) 1 tablet 2 (two) times daily as needed.    [provider]  CLARITIN REDITABS 10 MG dissolvable tablet TAKE 1 TABLET (10 MG TOTAL) BY MOUTH DAILY. 12/03/16   Margaretann LovelessBurnette, Jennifer M, PA-C  escitalopram (LEXAPRO) 10 MG tablet TAKE 1 & 1/2 TABLETS BY MOUTH ONCE A DAY 11/22/16   Margaretann LovelessBurnette, Jennifer M, PA-C  FLAXSEED, LINSEED, PO Take 1 tablet by mouth daily.    [provider]  Maca Root 500 MG CAPS Take 1 capsule by mouth daily.    [provider]  Melatonin 10 MG CAPS Take 1 capsule by mouth at bedtime.    [provider]  Methylsulfonylmethane 500 MG CAPS Take 1 capsule by mouth 2 (two) times daily.    [provider]  Misc Natural Products (TUMERSAID PO) Take 1,500 mg by mouth daily.    [provider]  montelukast (SINGULAIR) 10 MG tablet TAKE 1 TABLET (10 MG  TOTAL) BY MOUTH DAILY. 12/23/16   Margaretann Loveless, PA-C  MULTIPLE VITAMIN PO Take 1 tablet by mouth every other day.    [provider]  naproxen (NAPROSYN) 500 MG tablet Take 1 tablet (500 mg total) by mouth 2 (two) times daily with a meal. 06/23/16   Sharman Cheek, MD  ondansetron (ZOFRAN ODT) 4 MG disintegrating tablet Take 1 tablet (4 mg total) by mouth every 8 (eight) hours as needed for nausea or vomiting. 06/23/16   Sharman Cheek, MD    Allergies Patient has no known allergies.  Family History    Problem Relation Age of Onset  . Arthritis Mother   . Osteoporosis Mother   . Coronary artery disease Father   . Diabetes Father   . Heart disease Father   . Meniere's disease Father   . Arthritis Sister   . Hypertension Sister   . Dementia Maternal Grandmother   . Heart attack Maternal Grandfather   . Breast cancer Paternal Grandmother 32  . Pneumonia Paternal Grandmother   . Alcohol abuse Paternal Grandfather   . Pancreatitis Paternal Grandfather     Social History Social History   Tobacco Use  . Smoking status: Former Games developer  . Smokeless tobacco: Never Used  . Tobacco comment: Quit 1994  Substance Use Topics  . Alcohol use: Yes    Alcohol/week: 8.4 oz    Types: 14 Glasses of wine per week  . Drug use: No    Review of Systems Level 5 caveat:  history/ROS limited by acute/critical illness  ____________________________________________   PHYSICAL EXAM:  VITAL SIGNS: ED Triage Vitals [04/08/17 0348]  Enc Vitals Group     BP (!) 141/81     Pulse Rate 89     Resp      Temp 98 F (36.7 C)     Temp Source Oral     SpO2 94 %     Weight 77.1 kg (170 lb)     Height 1.549 m (5\' 1" )     Head Circumference      Peak Flow      Pain Score      Pain Loc      Pain Edu?      Excl. in GC?     Constitutional: Alert and oriented.  Appears to be in pain and in mild respiratory distress on nasal cannula Eyes: Conjunctivae are normal.  Head: Atraumatic. Nose: No congestion/rhinnorhea. Mouth/Throat: Mucous membranes are moist. Neck: No stridor.  No meningeal signs.   Cardiovascular: Normal rate, regular rhythm. Good peripheral circulation. Grossly normal heart sounds. Respiratory: Shallow inspirations due to pain.  Clear breath sounds on the right throughout.  Significantly diminished breath sounds near the apex of the left lung.  No wheezing or crackles . Gastrointestinal: Soft and nontender. No distention.  Musculoskeletal: No lower extremity tenderness nor edema. No  gross deformities of extremities. Neurologic:  Normal speech and language. No gross focal neurologic deficits are appreciated.  Skin:  Skin is warm, dry and intact. No rash noted.   ____________________________________________   LABS (all labs ordered are listed, but only abnormal results are displayed)  Labs Reviewed  CBC WITH DIFFERENTIAL/PLATELET - Abnormal; Notable for the following components:      Result Value   WBC 12.1 (*)    Lymphs Abs 5.1 (*)    All other components within normal limits  COMPREHENSIVE METABOLIC PANEL - Abnormal; Notable for the following components:   Glucose, Bld 156 (*)  All other components within normal limits  CULTURE, BLOOD (ROUTINE X 2)  CULTURE, BLOOD (ROUTINE X 2)  PROTIME-INR  TROPONIN I  LACTIC ACID, PLASMA  LACTIC ACID, PLASMA  PROCALCITONIN   ____________________________________________  EKG  ED ECG REPORT I, Loleta Rose, the attending physician, personally viewed and interpreted this ECG.  Date: 04/08/2017 EKG Time: 3:39 AM Rate: 92 Rhythm: normal sinus rhythm QRS Axis: normal Intervals: normal ST/T Wave abnormalities: Non-specific ST segment / T-wave changes, but no evidence of acute ischemia. Narrative Interpretation: no evidence of acute ischemia  ____________________________________________  RADIOLOGY I, Loleta Rose, personally viewed and evaluated these images (plain radiographs) as part of my medical decision making, as well as reviewing the written report by the radiologist.  Ct Angio Chest Pe W/cm &/or Wo Cm  Result Date: 04/08/2017 CLINICAL DATA:  Shortness of breath and chest pain EXAM: CT ANGIOGRAPHY CHEST WITH CONTRAST TECHNIQUE: Multidetector CT imaging of the chest was performed using the standard protocol during bolus administration of intravenous contrast. Multiplanar CT image reconstructions and MIPs were obtained to evaluate the vascular anatomy. CONTRAST:  75mL ISOVUE-370 IOPAMIDOL (ISOVUE-370) INJECTION  76% COMPARISON:  Chest radiograph 04/08/2017 Chest CT 06/23/2016 FINDINGS: Cardiovascular: Contrast injection is sufficient to demonstrate satisfactory opacification of the pulmonary arteries to the segmental level. There is no pulmonary embolus. The main pulmonary artery is within normal limits for size. There is no CT evidence of acute right heart strain. The visualized aorta is normal. There is a normal 3-vessel arch branching pattern. Heart size is normal, without pericardial effusion. Mediastinum/Nodes: Patulous lower esophagus. No mediastinal or axillary lymphadenopathy. Normal thyroid. Lungs/Pleura: Multifocal ground-glass opacity within the left upper and lower lobes with consolidation in the lingula. Bibasilar atelectasis. Upper Abdomen: Contrast bolus timing is not optimized for evaluation of the abdominal organs. Within this limitation, the visualized organs of the upper abdomen are normal. Musculoskeletal: No chest wall abnormality. No acute or significant osseous findings. Review of the MIP images confirms the above findings. IMPRESSION: 1. No pulmonary embolus or acute aortic syndrome. 2. Consolidation within the lingula with multifocal ground-glass opacity in the left upper and lower lobes. The appearance is most consistent with pneumonia. Electronically Signed   By: Deatra Robinson M.D.   On: 04/08/2017 05:42   Dg Chest Port 1 View  Result Date: 04/08/2017 CLINICAL DATA:  52 year old female with shortness of breath. EXAM: PORTABLE CHEST 1 VIEW COMPARISON:  Chest CT dated 06/23/2016 FINDINGS: There is shallow inspiration with minimal left lung base atelectatic changes. No focal consolidation, pleural effusion, or pneumothorax. The cardiac silhouette is within normal limits. No acute osseous pathology. IMPRESSION: Shallow inspiration with minimal bibasilar atelectasis. No focal consolidation. Electronically Signed   By: Elgie Collard M.D.   On: 04/08/2017 04:26     ____________________________________________   PROCEDURES  Critical Care performed: Yes, see critical care procedure note(s)   Procedure(s) performed:   Procedures   ____________________________________________   INITIAL IMPRESSION / ASSESSMENT AND PLAN / ED COURSE  As part of my medical decision making, I reviewed the following data within the electronic MEDICAL RECORD NUMBER Nursing notes reviewed and incorporated, Labs reviewed , EKG interpreted , Radiograph reviewed  and Discussed with admitting physician (Dr. Sheryle Hail)    Differential includes, but is not limited to, viral syndrome, bronchitis including COPD exacerbation, pneumonia, reactive airway disease including asthma, CHF including exacerbation with or without pulmonary/interstitial edema, pneumothorax, ACS, thoracic trauma, and pulmonary embolism.  However based on the patient's history and current presentation I am  very concerned about spontaneous pneumothorax.  She has diminished lung sounds and had acute onset of pain and shortness of breath and hypoxemia.   I will evaluate with lab work and stat radiograph and I have equipment at the bedside for emergent thoracostomy.  Clinical Course as of Apr 08 645  Fri Apr 08, 2017  9604 I reviewed the radiograph as did the radiologist and we see no evidence of pneumothorax on the x-ray.  However clinically this is quite possible.  Additionally now that the patient is feeling better on the nonrebreather and on the morphine, she told me that she recently got back from a multi hour drive to and from [place] over the holidays.  I will obtain a CT angio chest to rule out both pulmonary embolism and subtle pneumothorax that is not showing up on chest x-ray. DG Chest Port 1 View [CF]  0600 No indication of PE nor pneumothorax.  Patient does have what appears to be multilobar pneumonia.  Now that she is more comfortable, she was able to provide history that she had a "bad cold" a couple  of weeks ago and had been getting better in spite of a persistent cough.  The cough got significantly worse tonight.  No recent fever/chills or chest pain prior to the acute episode this evening.  I updated the patient who is much more comfortable now.  I discussed with her the need for admission given her hypoxemia, but she very much does not want to stay in the hospital if not necessary.  We agreed that I would take her off supplemental oxygen and ambulate her.  If she becomes hypoxemic then she will likely need to stay but otherwise I agreed to treat her as an outpatient. CT Angio Chest PE W/Cm &/Or Wo Cm [CF]  0630 Off oxygen, the patient dropped down to 84% on room air and stayed in the 87-90% range.  That is at rest while lying flat.  I discussed with her again and I think that she needs to come into the hospital at least for a short period for IV antibiotics and oxygen.  I will discussed with the hospitalist.  Though technically she meets criteria for sepsis, she is not severe sepsis and she is hemodynamic stable.  I have ordered standard community-acquired pneumonia treatment and further evaluation including IV fluids but will not call her code sepsis based on her generally reassuring clinical appearance aside from the hypoxemia.  [CF]    Clinical Course User Index [CF] Loleta Rose, MD    ____________________________________________  FINAL CLINICAL IMPRESSION(S) / ED DIAGNOSES  Final diagnoses:  Community acquired pneumonia of left lung, unspecified part of lung  Acute respiratory failure with hypoxemia (HCC)  Left-sided chest wall pain     MEDICATIONS GIVEN DURING THIS VISIT:  Medications  lidocaine (PF) (XYLOCAINE) 1 % injection 10 mL (not administered)  sodium chloride 0.9 % bolus 1,000 mL (not administered)  cefTRIAXone (ROCEPHIN) 1 g in dextrose 5 % 50 mL IVPB - Premix (not administered)  azithromycin (ZITHROMAX) 500 mg in dextrose 5 % 250 mL IVPB (not administered)  morphine  4 MG/ML injection 4 mg (4 mg Intravenous Given 04/08/17 0408)  ondansetron (ZOFRAN) injection 4 mg (4 mg Intravenous Given 04/08/17 0406)  iopamidol (ISOVUE-370) 76 % injection 75 mL (75 mLs Intravenous Contrast Given 04/08/17 0510)     ED Discharge Orders    None       Note:  This document was prepared using Dragon voice  recognition software and may include unintentional dictation errors.    Loleta Rose, MD 04/08/17 [phone]

## 2017-04-08 NOTE — H&P (Signed)
[facility] [place] at [facility]   PATIENT NAME: Tonya Zuniga    MR#:  [id number]  DATE OF BIRTH:  1966-03-12  DATE OF ADMISSION:  04/08/2017  PRIMARY CARE PHYSICIAN: Margaretann Loveless, PA-C   REQUESTING/REFERRING PHYSICIAN: Dr York Cerise  CHIEF COMPLAINT:  Chest pain and back pain noted since yesterday  HISTORY OF PRESENT ILLNESS:  Tonya Zuniga  is a 52 y.o. female with no known medical history comes to the emergency room for acute onset chest pain with shortness of breath and dry hacking cough.  Patient said she had a severe episode of coughing last night started hurting in her left lower back and chest pain came to the emergency room was found to have left lower lobe pneumonia. Her saturations were 88% on room air.  She is currently 94-99% on 2 L Patient has no history of any lung problems or cardiac problems. Received IV Zithromax and Rocephin is being admitted for community-acquired pneumonia and acute hypoxic respiratory failure.  PAST MEDICAL HISTORY:  History reviewed. No pertinent past medical history.  PAST SURGICAL HISTOIRY:   Past Surgical History:  Procedure Laterality Date  . APPENDECTOMY  1981  . KNEE SURGERY Right 01/05/2011    SOCIAL HISTORY:   Social History   Tobacco Use  . Smoking status: Former Games developer  . Smokeless tobacco: Never Used  . Tobacco comment: Quit 1994  Substance Use Topics  . Alcohol use: Yes    Alcohol/week: 8.4 oz    Types: 14 Glasses of wine per week    FAMILY HISTORY:   Family History  Problem Relation Age of Onset  . Arthritis Mother   . Osteoporosis Mother   . Coronary artery disease Father   . Diabetes Father   . Heart disease Father   . Meniere's disease Father   . Arthritis Sister   . Hypertension Sister   . Dementia Maternal Grandmother   . Heart attack Maternal Grandfather   . Breast cancer Paternal Grandmother 89  . Pneumonia Paternal Grandmother   . Alcohol abuse Paternal Grandfather   .  Pancreatitis Paternal Grandfather     DRUG ALLERGIES:  No Known Allergies  REVIEW OF SYSTEMS:  Review of Systems  Constitutional: Negative for chills, fever and weight loss.  HENT: Negative for ear discharge, ear pain and nosebleeds.   Eyes: Negative for blurred vision, pain and discharge.  Respiratory: Positive for cough and shortness of breath. Negative for sputum production, wheezing and stridor.   Cardiovascular: Positive for chest pain. Negative for palpitations, orthopnea and PND.  Gastrointestinal: Negative for abdominal pain, diarrhea, nausea and vomiting.  Genitourinary: Negative for frequency and urgency.  Musculoskeletal: Positive for back pain. Negative for joint pain.  Neurological: Positive for weakness. Negative for sensory change, speech change and focal weakness.  Psychiatric/Behavioral: Negative for depression and hallucinations. The patient is not nervous/anxious.      MEDICATIONS AT HOME:   Prior to Admission medications   Medication Sig Start Date End Date Taking? Authorizing Provider  buPROPion (WELLBUTRIN XL) 150 MG 24 hr tablet TAKE 1 TABLET (150 MG TOTAL) BY MOUTH DAILY. 11/22/16  Yes Margaretann Loveless, PA-C  Calcium Polycarbophil (FIBER-CAPS PO) Take 1 capsule by mouth daily.   Yes [provider]  Calcium-Magnesium-Vitamin D (CALCIUM 500 PO) 1 tablet 2 (two) times daily as needed.   Yes [provider]  escitalopram (LEXAPRO) 10 MG tablet TAKE 1 & 1/2 TABLETS BY MOUTH ONCE A DAY 11/22/16  Yes Joycelyn Man  M, PA-C  FLAXSEED, LINSEED, PO Take 1 tablet by mouth daily.   Yes [provider]  Maca Root 500 MG CAPS Take 1 capsule by mouth daily.   Yes [provider]  Melatonin 10 MG CAPS Take 1 capsule by mouth at bedtime.   Yes [provider]  Methylsulfonylmethane 500 MG CAPS Take 1 capsule by mouth 2 (two) times daily.   Yes [provider]  MULTIPLE VITAMIN PO Take 1 tablet by mouth every other  day.   Yes [provider]  TURMERIC PO Take 1,500 mg by mouth daily.   Yes [provider]      VITAL SIGNS:  Blood pressure 111/67, pulse 88, temperature 99.2 F (37.3 C), temperature source Oral, resp. rate (!) 21, height 5\' 1"  (1.549 m), weight 77.1 kg (170 lb), last menstrual period 03/29/2016, SpO2 99 %.  PHYSICAL EXAMINATION:  GENERAL:  52 y.o.-year-old patient lying in the bed with no acute distress.  EYES: Pupils equal, round, reactive to light and accommodation. No scleral icterus. Extraocular muscles intact.  HEENT: Head atraumatic, normocephalic. Oropharynx and nasopharynx clear.  NECK:  Supple, no jugular venous distention. No thyroid enlargement, no tenderness.  LUNGS: Normal breath sounds bilaterally, no wheezing, rales,rhonchi or crepitation. No use of accessory muscles of respiration.  CARDIOVASCULAR: S1, S2 normal. No murmurs, rubs, or gallops.  ABDOMEN: Soft, nontender, nondistended. Bowel sounds present. No organomegaly or mass.  EXTREMITIES: No pedal edema, cyanosis, or clubbing.  NEUROLOGIC: Cranial nerves II through XII are intact. Muscle strength 5/5 in all extremities. Sensation intact. Gait not checked.  PSYCHIATRIC: The patient is alert and oriented x 3.  SKIN: No obvious rash, lesion, or ulcer.   LABORATORY PANEL:   CBC Recent Labs  Lab 04/08/17 0340  WBC 12.1*  HGB 14.3  HCT 42.1  PLT 333   ------------------------------------------------------------------------------------------------------------------  Chemistries  Recent Labs  Lab 04/08/17 0340  NA 145  K 3.8  CL 107  CO2 24  GLUCOSE 156*  BUN 19  CREATININE 0.75  CALCIUM 9.2  AST 29  ALT 26  ALKPHOS 84  BILITOT 0.5   ------------------------------------------------------------------------------------------------------------------  Cardiac Enzymes Recent Labs  Lab 04/08/17 0340  TROPONINI <0.03    ------------------------------------------------------------------------------------------------------------------  RADIOLOGY:  Ct Angio Chest Pe W/cm &/or Wo Cm  Result Date: 04/08/2017 CLINICAL DATA:  Shortness of breath and chest pain EXAM: CT ANGIOGRAPHY CHEST WITH CONTRAST TECHNIQUE: Multidetector CT imaging of the chest was performed using the standard protocol during bolus administration of intravenous contrast. Multiplanar CT image reconstructions and MIPs were obtained to evaluate the vascular anatomy. CONTRAST:  75mL ISOVUE-370 IOPAMIDOL (ISOVUE-370) INJECTION 76% COMPARISON:  Chest radiograph 04/08/2017 Chest CT 06/23/2016 FINDINGS: Cardiovascular: Contrast injection is sufficient to demonstrate satisfactory opacification of the pulmonary arteries to the segmental level. There is no pulmonary embolus. The main pulmonary artery is within normal limits for size. There is no CT evidence of acute right heart strain. The visualized aorta is normal. There is a normal 3-vessel arch branching pattern. Heart size is normal, without pericardial effusion. Mediastinum/Nodes: Patulous lower esophagus. No mediastinal or axillary lymphadenopathy. Normal thyroid. Lungs/Pleura: Multifocal ground-glass opacity within the left upper and lower lobes with consolidation in the lingula. Bibasilar atelectasis. Upper Abdomen: Contrast bolus timing is not optimized for evaluation of the abdominal organs. Within this limitation, the visualized organs of the upper abdomen are normal. Musculoskeletal: No chest wall abnormality. No acute or significant osseous findings. Review of the MIP images confirms the above findings. IMPRESSION:  1. No pulmonary embolus or acute aortic syndrome. 2. Consolidation within the lingula with multifocal ground-glass opacity in the left upper and lower lobes. The appearance is most consistent with pneumonia. Electronically Signed   By: Deatra RobinsonKevin  Herman M.D.   On: 04/08/2017 05:42   Dg Chest Port  1 View  Result Date: 04/08/2017 CLINICAL DATA:  52 year old female with shortness of breath. EXAM: PORTABLE CHEST 1 VIEW COMPARISON:  Chest CT dated 06/23/2016 FINDINGS: There is shallow inspiration with minimal left lung base atelectatic changes. No focal consolidation, pleural effusion, or pneumothorax. The cardiac silhouette is within normal limits. No acute osseous pathology. IMPRESSION: Shallow inspiration with minimal bibasilar atelectasis. No focal consolidation. Electronically Signed   By: Elgie CollardArash  Radparvar M.D.   On: 04/08/2017 04:26    EKG:    IMPRESSION AND PLAN:   Tonya Zuniga  is a 52 y.o. female with no known medical history comes to the emergency room for acute onset chest pain with shortness of breath and dry hacking cough.  Patient said she had a severe episode of coughing last night started hurting in her left lower back and chest pain came to the emergency room was found to have left lower lobe pneumonia.   1.  Acute hypoxic respiratory failure secondary to left lower lobe pneumonia with pleuritic chest pain -CT chest negative for PE -CT chest positive for left lower lobe pneumonia -Admit to medical floor -IV fluids -IV Rocephin and Zithromax -Incentive spirometer, nebulizer if needed -Wean oxygen to room air. -Follow blood culture  2.  Leukocytosis due to pneumonia  3.  Pleuritic chest pain secondary to muscular strain from coughing spell -PRN pain meds  4.  DVT prophylaxis subcu Lovenox  Above was discussed with patient patient's husband in the emergency room.  All the records are reviewed and case discussed with ED provider. Management plans discussed with the patient, family and they are in agreement.  CODE STATUS: full  TOTAL TIME TAKING CARE OF THIS PATIENT: **45 minutes.    Tonya Zuniga M.D on 04/08/2017 at 10:54 AM  Between 7am to 6pm - Pager - [phone]  After 6pm go to [email or web address] - password [email or web address]  [facility]  Office   [phone]  CC: Primary care physician; Margaretann LovelessBurnette, Jennifer M, PA-C

## 2017-04-08 NOTE — ED Notes (Signed)
Pt states that she takes her ordered Lexapro at night.

## 2017-04-08 NOTE — ED Triage Notes (Signed)
Pt reports woke up coughing for around 2 hours when suddenly left sided pain w/ SOB occurred. Pt states "im no doctor but I think its a collapsed lung"

## 2017-04-08 NOTE — ED Notes (Signed)
Report to Paulette, RN  

## 2017-04-08 NOTE — ED Notes (Signed)
Lidocaine given to MD for administration

## 2017-04-08 NOTE — ED Notes (Signed)
Pt given breakfast tray

## 2017-04-08 NOTE — ED Notes (Signed)
Pt resting in bed comfortably, lights dimmed per request. No further needs indicated.

## 2017-04-09 MED ORDER — ORAL CARE MOUTH RINSE: 15.0000 mL | Freq: Two times a day (BID) | OROMUCOSAL | Status: DC

## 2017-04-09 MED ORDER — LEVOFLOXACIN 750 MG PO TABS: 750.0000 mg | ORAL_TABLET | Freq: Every day | ORAL | 0 refills | Status: AC

## 2017-04-09 NOTE — Progress Notes (Signed)
Patient discharged to home, on room air, per MD patient can use OTC Aleve for chest pain r/t cough, discharged on oral abx, no falls/injuries, no acute concerns, d/c instrutions reviewed with patient, no further quesiotns.

## 2017-04-09 NOTE — Discharge Summary (Signed)
[facility] [place] [facility]   PATIENT NAME: Tonya KnudsenSara Zuniga    MR#:  [id number]  DATE OF BIRTH:  07/21/1965  DATE OF ADMISSION:  04/08/2017 ADMITTING PHYSICIAN: Enedina FinnerSona Patel, MD  DATE OF DISCHARGE: 04/09/2017  PRIMARY CARE PHYSICIAN: Margaretann LovelessBurnette, Jennifer M, PA-C    ADMISSION DIAGNOSIS:  SOB (shortness of breath) [R06.02] Hypoxia [R09.02] Left-sided chest wall pain [R07.89] Acute respiratory failure with hypoxemia (HCC) [J96.01] Community acquired pneumonia of left lung, unspecified part of lung [J18.9]  DISCHARGE DIAGNOSIS:  Active Problems:   Community acquired bacterial pneumonia   SECONDARY DIAGNOSIS:  History reviewed. No pertinent past medical history.  HOSPITAL COURSE:   52 year old female with past medical history of depression, osteoporosis who presented to the hospital due to shortness of breath and pleuritic chest pain noted to have pneumonia.  1. Acute respiratory failure with hypoxia-secondary to pneumonia. -Patient was placed on O2 supplementation, and given IV antibiotics for his pneumonia. Patient has clinically improved and was ambulated without any further hypoxemia. She is being discharged home on oral Levaquin.  2. Pneumonia-this is the cause of patient's respiratory failure. Patient was treated with IV ceftriaxone and Zithromax in the hospital, not being discharged on oral Levaquin.  3. Pleuritic chest pain-secondary to the pneumonia. -Improved with supportive care. Patient will continue some NSAIDs as needed at home.  DISCHARGE CONDITIONS:   Stable  CONSULTS OBTAINED:    DRUG ALLERGIES:  No Known Allergies  DISCHARGE MEDICATIONS:   Allergies as of 04/09/2017   No Known Allergies     Medication List    TAKE these medications   buPROPion 150 MG 24 hr tablet Commonly known as:  WELLBUTRIN XL TAKE 1 TABLET (150 MG TOTAL) BY MOUTH DAILY.   CALCIUM 500 PO 1 tablet 2 (two) times daily as needed.   escitalopram 10 MG  tablet Commonly known as:  LEXAPRO TAKE 1 & 1/2 TABLETS BY MOUTH ONCE A DAY   FIBER-CAPS PO Take 1 capsule by mouth daily.   FLAXSEED (LINSEED) PO Take 1 tablet by mouth daily.   levofloxacin 750 MG tablet Commonly known as:  LEVAQUIN Take 1 tablet (750 mg total) by mouth daily for 5 days.   Maca Root 500 MG Caps Take 1 capsule by mouth daily.   Melatonin 10 MG Caps Take 1 capsule by mouth at bedtime.   Methylsulfonylmethane 500 MG Caps Take 1 capsule by mouth 2 (two) times daily.   MULTIPLE VITAMIN PO Take 1 tablet by mouth every other day.   TURMERIC PO Take 1,500 mg by mouth daily.         DISCHARGE INSTRUCTIONS:   DIET:  Regular diet  DISCHARGE CONDITION:  Stable  ACTIVITY:  Activity as tolerated  OXYGEN:  Home Oxygen: No.   Oxygen Delivery: room air  DISCHARGE LOCATION:  home   If you experience worsening of your admission symptoms, develop shortness of breath, life threatening emergency, suicidal or homicidal thoughts you must seek medical attention immediately by calling 911 or calling your MD immediately  if symptoms less severe.  You Must read complete instructions/literature along with all the possible adverse reactions/side effects for all the Medicines you take and that have been prescribed to you. Take any new Medicines after you have completely understood and accpet all the possible adverse reactions/side effects.   Please note  You were cared for by a hospitalist during your hospital stay. If you have any questions about your discharge medications or the care you received while  you were in the hospital after you are discharged, you can call the unit and asked to speak with the hospitalist on call if the hospitalist that took care of you is not available. Once you are discharged, your primary care physician will handle any further medical issues. Please note that NO REFILLS for any discharge medications will be authorized once you are  discharged, as it is imperative that you return to your primary care physician (or establish a relationship with a primary care physician if you do not have one) for your aftercare needs so that they can reassess your need for medications and monitor your lab values.     Today   Remains afebrile, hemodynamically stable. Still has some pleuritic chest pain but improved since yesterday. Not hypoxic on ambulation. We'll discharge home today.  VITAL SIGNS:  Blood pressure 113/64, pulse 79, temperature 98.8 F (37.1 C), temperature source Oral, resp. rate 16, height 5\' 1"  (1.549 m), weight 77.1 kg (170 lb), last menstrual period 03/29/2016, SpO2 94 %.  I/O:    Intake/Output Summary (Last 24 hours) at 04/09/2017 1333 Last data filed at 04/09/2017 1029 Gross per 24 hour  Intake 530 ml  Output 0 ml  Net 530 ml    PHYSICAL EXAMINATION:  GENERAL:  52 y.o.-year-old patient lying in the bed with no acute distress.  EYES: Pupils equal, round, reactive to light and accommodation. No scleral icterus. Extraocular muscles intact.  HEENT: Head atraumatic, normocephalic. Oropharynx and nasopharynx clear.  NECK:  Supple, no jugular venous distention. No thyroid enlargement, no tenderness.  LUNGS: Normal breath sounds bilaterally, no wheezing, rales,rhonchi. No use of accessory muscles of respiration.  CARDIOVASCULAR: S1, S2 normal. No murmurs, rubs, or gallops.  ABDOMEN: Soft, non-tender, non-distended. Bowel sounds present. No organomegaly or mass.  EXTREMITIES: No pedal edema, cyanosis, or clubbing.  NEUROLOGIC: Cranial nerves II through XII are intact. No focal motor or sensory defecits b/l.  PSYCHIATRIC: The patient is alert and oriented x 3.  SKIN: No obvious rash, lesion, or ulcer.   DATA REVIEW:   CBC Recent Labs  Lab 04/08/17 0340  WBC 12.1*  HGB 14.3  HCT 42.1  PLT 333    Chemistries  Recent Labs  Lab 04/08/17 0340  NA 145  K 3.8  CL 107  CO2 24  GLUCOSE 156*  BUN 19   CREATININE 0.75  CALCIUM 9.2  AST 29  ALT 26  ALKPHOS 84  BILITOT 0.5    Cardiac Enzymes Recent Labs  Lab 04/08/17 0340  TROPONINI <0.03    Microbiology Results  Results for orders placed or performed during the hospital encounter of 04/08/17  Blood Culture (routine x 2)     Status: None (Preliminary result)   Collection Time: 04/08/17  6:37 AM  Result Value Ref Range Status   Specimen Description BLOOD LEFT ASSIST CONTROL  Final   Special Requests BOTTLES DRAWN AEROBIC AND ANAEROBIC BCHV  Final   Culture   Final    NO GROWTH < 24 HOURS Performed at [facility], [address] [place], [place] [address]    Report Status PENDING  Incomplete  Blood Culture (routine x 2)     Status: None (Preliminary result)   Collection Time: 04/08/17  6:37 AM  Result Value Ref Range Status   Specimen Description BLOOD RIGHT ASSIST CONTROL  Final   Special Requests BOTTLES DRAWN AEROBIC AND ANAEROBIC BCAV  Final   Culture   Final    NO GROWTH < 24  HOURS Performed at [facility], [address] [place], [place] [address]    Report Status PENDING  Incomplete    RADIOLOGY:  Ct Angio Chest Pe W/cm &/or Wo Cm  Result Date: 04/08/2017 CLINICAL DATA:  Shortness of breath and chest pain EXAM: CT ANGIOGRAPHY CHEST WITH CONTRAST TECHNIQUE: Multidetector CT imaging of the chest was performed using the standard protocol during bolus administration of intravenous contrast. Multiplanar CT image reconstructions and MIPs were obtained to evaluate the vascular anatomy. CONTRAST:  75mL ISOVUE-370 IOPAMIDOL (ISOVUE-370) INJECTION 76% COMPARISON:  Chest radiograph 04/08/2017 Chest CT 06/23/2016 FINDINGS: Cardiovascular: Contrast injection is sufficient to demonstrate satisfactory opacification of the pulmonary arteries to the segmental level. There is no pulmonary embolus. The main pulmonary artery is within normal limits for size. There is no CT evidence of acute right heart strain. The  visualized aorta is normal. There is a normal 3-vessel arch branching pattern. Heart size is normal, without pericardial effusion. Mediastinum/Nodes: Patulous lower esophagus. No mediastinal or axillary lymphadenopathy. Normal thyroid. Lungs/Pleura: Multifocal ground-glass opacity within the left upper and lower lobes with consolidation in the lingula. Bibasilar atelectasis. Upper Abdomen: Contrast bolus timing is not optimized for evaluation of the abdominal organs. Within this limitation, the visualized organs of the upper abdomen are normal. Musculoskeletal: No chest wall abnormality. No acute or significant osseous findings. Review of the MIP images confirms the above findings. IMPRESSION: 1. No pulmonary embolus or acute aortic syndrome. 2. Consolidation within the lingula with multifocal ground-glass opacity in the left upper and lower lobes. The appearance is most consistent with pneumonia. Electronically Signed   By: Deatra Robinson M.D.   On: 04/08/2017 05:42   Dg Chest Port 1 View  Result Date: 04/08/2017 CLINICAL DATA:  52 year old female with shortness of breath. EXAM: PORTABLE CHEST 1 VIEW COMPARISON:  Chest CT dated 06/23/2016 FINDINGS: There is shallow inspiration with minimal left lung base atelectatic changes. No focal consolidation, pleural effusion, or pneumothorax. The cardiac silhouette is within normal limits. No acute osseous pathology. IMPRESSION: Shallow inspiration with minimal bibasilar atelectasis. No focal consolidation. Electronically Signed   By: Elgie Collard M.D.   On: 04/08/2017 04:26      Management plans discussed with the patient, family and they are in agreement.  CODE STATUS:     Code Status Orders  (From admission, onward)        Start     Ordered   04/08/17 0945  Full code  Continuous     04/08/17 0944    Code Status History    Date Active Date Inactive Code Status Order ID Comments User Context   This patient has a current code status but no  historical code status.      TOTAL TIME TAKING CARE OF THIS PATIENT: 40 minutes.    Houston Siren M.D on 04/09/2017 at 1:33 PM  Between 7am to 6pm - Pager - [phone]  After 6pm go to [email or web address] - Scientist, research (life sciences) [place] [facility]  Office  [phone]  CC: Primary care physician; Margaretann Loveless, PA-C

## 2017-04-11 ENCOUNTER — Telehealth: Payer: Self-pay | Admitting: Physician Assistant

## 2017-04-11 NOTE — Telephone Encounter (Signed)
Called patient to follow-up on her discharge from the Hospital.Per patient she is taking the Levaquin and is doing much better. She reports that she is going out of town on Monday afternoon and asked if possible can she be seen on Friday. Patient is scheduled for hospital follow-up 01/21 at 8 am.  Thanks,  -Joseline

## 2017-04-11 NOTE — Telephone Encounter (Signed)
Transition Care Management Follow-Up Telephone Call   Date discharged and where: [facility] on 04/09/17  How have you been since you were released from the hospital? Doing better, not coughing as much and the pink sputum has decreased. Denies fever, nausea, vomiting or diarrhea.   Any patient concerns? None.  Items Reviewed:   Meds: verified  Allergies: verified  Dietary Changes Reviewed: N/A  Functional Questionnaire:  Independent-I Dependent-D  ADLs:   Dressing- I    Eating- I   Maintaining continence- I   Transferring- I   Transportation- I   Meal Prep- I   Managing Meds- I  Confirmed importance and Date/Time of follow-up visits scheduled: 04/18/17   Confirmed with patient if condition worsens to call PCP or go to the Emergency Dept. Patient was given office number and encouraged to call back with questions or concerns: YES

## 2017-04-11 NOTE — Telephone Encounter (Signed)
Pt is scheduled for hospital f/u on 04/18/17. Pt stated she was discharged 04/09/17 and was treated for pneumonia. Please advise. Thanks TNP

## 2017-04-13 LAB — CULTURE, BLOOD (ROUTINE X 2)
CULTURE: NO GROWTH
Culture: NO GROWTH

## 2017-04-18 ENCOUNTER — Encounter: Payer: Self-pay | Admitting: Physician Assistant

## 2017-04-18 ENCOUNTER — Ambulatory Visit (INDEPENDENT_AMBULATORY_CARE_PROVIDER_SITE_OTHER): Payer: BLUE CROSS/BLUE SHIELD | Admitting: Physician Assistant

## 2017-04-18 ENCOUNTER — Ambulatory Visit
Admission: RE | Admit: 2017-04-18 | Discharge: 2017-04-18 | Disposition: A | Discharge status: Home / Self Care | Payer: BLUE CROSS/BLUE SHIELD | Source: Ambulatory Visit | Attending: Physician Assistant | Admitting: Physician Assistant

## 2017-04-18 VITALS — BP 112/68 | HR 80 | Temp 98.5°F | Resp 16 | Ht 61.0 in | Wt 173.0 lb

## 2017-04-18 DIAGNOSIS — J189 Pneumonia, unspecified organism: Secondary | ICD-10-CM

## 2017-04-18 DIAGNOSIS — L508 Other urticaria: Secondary | ICD-10-CM

## 2017-04-18 DIAGNOSIS — F5101 Primary insomnia: Secondary | ICD-10-CM

## 2017-04-18 DIAGNOSIS — Z9189 Other specified personal risk factors, not elsewhere classified: Principal | ICD-10-CM

## 2017-04-18 DIAGNOSIS — IMO0001 Reserved for inherently not codable concepts without codable children: Secondary | ICD-10-CM

## 2017-04-18 MED ORDER — ZOLPIDEM TARTRATE 5 MG PO TABS: 2.5000 mg | ORAL_TABLET | Freq: Every evening | ORAL | 1 refills | Status: AC | PRN

## 2017-04-18 MED ORDER — LEVOFLOXACIN 500 MG PO TABS: 500.0000 mg | ORAL_TABLET | Freq: Every day | ORAL | 0 refills | Status: AC

## 2017-04-18 NOTE — Patient Instructions (Signed)
Hives  Hives (urticaria) are itchy, red, swollen areas on your skin. Hives can appear on any part of your body and can vary in size. They can be as small as the tip of a pen or much larger. Hives often fade within 24 hours (acute hives). In other cases, new hives appear after old ones fade. This cycle can continue for several days or weeks (chronic hives).  Hives result from your body's reaction to an irritant or to something that you are allergic to (trigger). When you are exposed to a trigger, your body releases a chemical (histamine) that causes redness, itching, and swelling. You can get hives immediately after being exposed to a trigger or hours later.  Hives do not spread from person to person (are not contagious). Your hives may get worse with scratching, exercise, and emotional stress.  What are the causes?  Causes of this condition include:   Allergies to certain foods or ingredients.   Insect bites or stings.   Exposure to pollen or pet dander.   Contact with latex or chemicals.   Spending time in sunlight, heat, or cold (exposure).   Exercise.   Stress.    You can also get hives from some medical conditions and treatments. These include:   Viruses, including the common cold.   Bacterial infections, such as urinary tract infections and strep throat.   Disorders such as vasculitis, lupus, or thyroid disease.   Certain medications.   Allergy shots.   Blood transfusions.    Sometimes, the cause of hives is not known (idiopathic hives).  What increases the risk?  This condition is more likely to develop in:   Women.   People who have food allergies, especially to citrus fruits, milk, eggs, peanuts, tree nuts, or shellfish.   People who are allergic to:  ? Medicines.  ? Latex.  ? Insects.  ? Animals.  ? Pollen.   People who have certain medical conditions, includinglupus or thyroid disease.    What are the signs or symptoms?  The main symptom of this condition is raised, itchyred or white  bumps or patches on your skin. These areas may:   Become large and swollen (welts).   Change in shape and location, quickly and repeatedly.   Be separate hives or connect over a large area of skin.   Sting or become painful.   Turn white when pressed in the center (blanch).    In severe cases, yourhands, feet, and face may also become swollen. This may occur if hives develop deeper in your skin.  How is this diagnosed?  This condition is diagnosed based on your symptoms, medical history, and physical exam. Your skin, urine, or blood may be tested to find out what is causing your hives and to rule out other health issues. Your health care provider may also remove a small sample of skin from the affected area and examine it under a microscope (biopsy).  How is this treated?  Treatment depends on the severity of your condition. Your health care provider may recommend using cool, wet cloths (cool compresses) or taking cool showers to relieve itching. Hives are sometimes treated with medicines, including:   Antihistamines.   Corticosteroids.   Antibiotics.   An injectable medicine (omalizumab). Your health care provider may prescribe this if you have chronic idiopathic hives and you continue to have symptoms even after treatment with antihistamines.    Severe cases may require an emergency injection of adrenaline (epinephrine) to prevent a   life-threatening allergic reaction (anaphylaxis).  Follow these instructions at home:  Medicines   Take or apply over-the-counter and prescription medicines only as told by your health care provider.   If you were prescribed an antibiotic medicine, use it as told by your health care provider. Do not stop taking the antibiotic even if you start to feel better.  Skin Care   Apply cool compresses to the affected areas.   Do not scratch or rub your skin.  General instructions   Do not take hot showers or baths. This can make itching worse.   Do not wear tight-fitting  clothing.   Use sunscreen and wear protective clothing when you are outside.   Avoid any substances that cause your hives. Keep a journal to help you track what causes your hives. Write down:  ? What medicines you take.  ? What you eat and drink.  ? What products you use on your skin.   Keep all follow-up visits as told by your health care provider. This is important.  Contact a health care provider if:   Your symptoms are not controlled with medicine.   Your joints are painful or swollen.  Get help right away if:   You have a fever.   You have pain in your abdomen.   Your tongue or lips are swollen.   Your eyelids are swollen.   Your chest or throat feels tight.   You have trouble breathing or swallowing.  These symptoms may represent a serious problem that is an emergency. Do not wait to see if the symptoms will go away. Get medical help right away. Call your local emergency services (911 in the [place] Do not drive yourself to the hospital.  This information is not intended to replace advice given to you by your health care provider. Make sure you discuss any questions you have with your health care provider.  Document Released: 03/15/2005 Document Revised: 08/13/2015 Document Reviewed: 01/01/2015  Elsevier Interactive Patient Education  2018 Elsevier Inc.

## 2017-04-18 NOTE — Progress Notes (Addendum)
Patient: Tonya Zuniga Female    DOB: 06/10/1965   52 y.o.   MRN: [id number] Visit Date: 04/18/2017  Today's Provider: Margaretann LovelessJennifer M Damel Querry, PA-C   Chief Complaint  Patient presents with  . Hospitalization Follow-up   Subjective:    HPI  Follow up Hospitalization  [facility] and discharged on 04/09/2017. She was treated for pneumonia. Treatment for this included Levaquin 500mg . She reports good compliance with treatment. She reports this condition is Improved. Transition of care call done on 04/11/17.  She also has acute complaint of chronic urticaria that has been going on x 3 months. It occurs sporadically. She has been using benadryl with good response but benadryl makes her sleepy. She has been under more stress at work and not sleeping well. She does use melatonin to help her sleep. It is not working well. She has used [place] in the past successfully.     No Known Allergies   Current Outpatient Medications:  .  buPROPion (WELLBUTRIN XL) 150 MG 24 hr tablet, TAKE 1 TABLET (150 MG TOTAL) BY MOUTH DAILY., Disp: 90 tablet, Rfl: 1 .  Calcium Polycarbophil (FIBER-CAPS PO), Take 1 capsule by mouth daily., Disp: , Rfl:  .  Calcium-Magnesium-Vitamin D (CALCIUM 500 PO), 1 tablet 2 (two) times daily as needed., Disp: , Rfl:  .  escitalopram (LEXAPRO) 10 MG tablet, TAKE 1 & 1/2 TABLETS BY MOUTH ONCE A DAY, Disp: 135 tablet, Rfl: 1 .  FLAXSEED, LINSEED, PO, Take 1 tablet by mouth daily., Disp: , Rfl:  .  Maca Root 500 MG CAPS, Take 1 capsule by mouth daily., Disp: , Rfl:  .  Melatonin 10 MG CAPS, Take 1 capsule by mouth at bedtime., Disp: , Rfl:  .  Methylsulfonylmethane 500 MG CAPS, Take 1 capsule by mouth 2 (two) times daily., Disp: , Rfl:  .  MULTIPLE VITAMIN PO, Take 1 tablet by mouth every other day., Disp: , Rfl:  .  TURMERIC PO, Take 1,500 mg by mouth daily., Disp: , Rfl:   Review of Systems  Constitutional: Negative.   Respiratory: Negative  for apnea, cough, choking, chest tightness, shortness of breath, wheezing and stridor.   Cardiovascular: Negative for chest pain, palpitations and leg swelling.  Gastrointestinal: Negative for abdominal pain and nausea.  Skin: Positive for rash (sporadic hives).  Allergic/Immunologic: Negative.   Neurological: Negative.   Psychiatric/Behavioral: Positive for sleep disturbance.    Social History   Tobacco Use  . Smoking status: Former Games developermoker  . Smokeless tobacco: Never Used  . Tobacco comment: Quit 1994  Substance Use Topics  . Alcohol use: Yes    Alcohol/week: 8.4 oz    Types: 14 Glasses of wine per week   Objective:   BP 112/68 (BP Location: Right Arm, Patient Position: Sitting, Cuff Size: Normal)   Pulse 80   Temp 98.5 F (36.9 C)   Resp 16   Ht 5\' 1"  (1.549 m)   Wt 173 lb (78.5 kg)   LMP 03/29/2016 Comment: It lasted 9 days and it was very heavy. After not having it for 6 months.  SpO2 95%   BMI 32.69 kg/m  Vitals:   04/18/17 0812  BP: 112/68  Pulse: 80  Resp: 16  Temp: 98.5 F (36.9 C)  SpO2: 95%  Weight: 173 lb (78.5 kg)  Height: 5\' 1"  (1.549 m)     Physical Exam  Constitutional: She appears well-developed and well-nourished. No distress.  Neck: Normal range of motion. Neck supple.  Cardiovascular: Normal rate, regular rhythm and normal heart sounds. Exam reveals no gallop and no friction rub.  No murmur heard. Pulmonary/Chest: Effort normal and breath sounds normal. No respiratory distress. She has no wheezes. She has no rales.  Skin: She is not diaphoretic.  Psychiatric: She has a normal mood and affect. Her behavior is normal. Judgment and thought content normal.  Vitals reviewed.     Assessment & Plan:      1. Transition of care performed with sharing of clinical summary Hospital notes, labs and imaging reviewed from 04/08/17-04/09/17. Transition call placed on 04/11/17.  2. Pneumonia of left lung due to infectious organism, unspecified part of  lung Will check CXR for clearance as below. Patient has completed antibiotic and feels 90% better. I will f/u pending results.  - DG Chest 2 View; Future  Addend: CLINICAL DATA:  Diagnosed with left-sided pneumonia on 04/08/2017. Follow-up examination.  EXAM: CHEST  2 VIEW  COMPARISON:  04/08/2017; 06/23/2016; chest CT-04/08/2017; 06/23/2016  FINDINGS: Grossly unchanged cardiac silhouette and mediastinal contours. Improved aeration of the left lung with persistent left infrahilar heterogeneous opacities. No new focal airspace opacities. No pleural effusion or pneumothorax. No acute osseus abnormalities. Old/healed fractures involving the posterolateral aspects of the right eighth and ninth ribs.  IMPRESSION: Overall improved aeration of the left lung with persistent lingular opacities worrisome for residual infection. A follow-up chest radiograph in 3 to 4 weeks after treatment is recommended to ensure resolution.   Electronically Signed   By: Simonne Come M.D.   On: 04/18/2017 09:08 Will send in levaquin 500mg  x 7 days and will recheck CXR in 3-4 weeks following treatment due to persistent infection in left lingula.   3. Primary insomnia Will restart low dose ambien as below. She is to call if no improvements.  - zolpidem (AMBIEN) 5 MG tablet; Take 0.5-1 tablets (2.5-5 mg total) by mouth at bedtime as needed for sleep.  Dispense: 30 tablet; Refill: 1  4. Chronic urticaria Discussed triggers, using benadryl and zantac. May use a non drowsy antihistamine such as claritin, zyrtec, allegra but would not work as quickly.        Margaretann Loveless, PA-C  [facility]

## 2017-04-18 NOTE — Addendum Note (Signed)
Addended by: Margaretann LovelessBURNETTE, Adarryl Goldammer M on: 04/18/2017 09:32 AM   Modules accepted: Orders

## 2017-04-18 NOTE — Addendum Note (Signed)
Addended by: Margaretann LovelessBURNETTE, Gita Dilger M on: 04/18/2017 08:57 AM   Modules accepted: Level of Service

## 2017-04-28 ENCOUNTER — Other Ambulatory Visit: Payer: Self-pay | Admitting: Physician Assistant

## 2017-04-28 DIAGNOSIS — F329 Major depressive disorder, single episode, unspecified: Secondary | ICD-10-CM

## 2017-04-28 DIAGNOSIS — F32A Depression, unspecified: Secondary | ICD-10-CM

## 2017-04-28 NOTE — Telephone Encounter (Signed)
[facility] pharmacy faxed a refill request for a 90-days supply for the following medications. Thanks CC  escitalopram (LEXAPRO) 10 MG tablet   buPROPion (WELLBUTRIN XL) 150 MG 24 hr tablet

## 2017-04-29 MED ORDER — ESCITALOPRAM OXALATE 10 MG PO TABS: ORAL_TABLET | ORAL | 1 refills | Status: AC

## 2017-04-29 MED ORDER — BUPROPION HCL ER (XL) 150 MG PO TB24: ORAL_TABLET | ORAL | 1 refills | Status: AC

## 2017-05-05 ENCOUNTER — Telehealth: Payer: Self-pay | Admitting: Physician Assistant

## 2017-05-05 DIAGNOSIS — J159 Unspecified bacterial pneumonia: Secondary | ICD-10-CM

## 2017-05-05 NOTE — Telephone Encounter (Signed)
Pt states that she is supposed to get f/u chest x ray.Can you put order in ?

## 2017-05-05 NOTE — Telephone Encounter (Signed)
Patient advised as below.  

## 2017-05-05 NOTE — Telephone Encounter (Signed)
Ordered

## 2017-05-11 ENCOUNTER — Telehealth: Payer: Self-pay | Admitting: Physician Assistant

## 2017-05-11 DIAGNOSIS — G5603 Carpal tunnel syndrome, bilateral upper limbs: Secondary | ICD-10-CM

## 2017-05-11 NOTE — Telephone Encounter (Signed)
Referral placed.

## 2017-05-11 NOTE — Telephone Encounter (Signed)
Patient states that she discussed carpal tunnel with you at her visit on 12/22/2016 and you told her to try braces and it is not helping.  She also states that she now has developed a hard ridge on her hand "like there is a tight tendon in there or something."  She states that you had mentioned referring her to a orthopedist and she thinks it may be time to do that.  She states that whoever you recommend is fine with her.

## 2017-05-11 NOTE — Telephone Encounter (Signed)
Advised patient

## 2017-05-13 ENCOUNTER — Ambulatory Visit
Admission: RE | Admit: 2017-05-13 | Discharge: 2017-05-13 | Disposition: A | Discharge status: Home / Self Care | Payer: BLUE CROSS/BLUE SHIELD | Source: Ambulatory Visit | Attending: Physician Assistant | Admitting: Physician Assistant

## 2017-05-13 ENCOUNTER — Telehealth: Payer: Self-pay

## 2017-05-13 DIAGNOSIS — J189 Pneumonia, unspecified organism: Secondary | ICD-10-CM | POA: Diagnosis present

## 2017-05-13 DIAGNOSIS — Z09 Encounter for follow-up examination after completed treatment for conditions other than malignant neoplasm: Secondary | ICD-10-CM | POA: Diagnosis not present

## 2017-05-13 DIAGNOSIS — J159 Unspecified bacterial pneumonia: Secondary | ICD-10-CM

## 2017-05-13 DIAGNOSIS — Z8701 Personal history of pneumonia (recurrent): Secondary | ICD-10-CM | POA: Insufficient documentation

## 2017-05-13 NOTE — Telephone Encounter (Signed)
Patient advised as directed below.  Thanks,  -Lindon Kiel 

## 2017-05-13 NOTE — Telephone Encounter (Signed)
-----   Message from Margaretann LovelessJennifer M Burnette, PA-C sent at 05/13/2017  9:55 AM EST ----- CXR now clear!

## 2017-05-23 ENCOUNTER — Encounter: Payer: Self-pay | Admitting: Physician Assistant

## 2017-09-30 IMAGING — CR DG CHEST 2V
1 series · 2 of 2 positions shown · non-contrast
Comparison: 06/21/2016

CLINICAL DATA: Recent fall with right-sided rib pain, subsequent
encounter

EXAM:
CHEST  2 VIEW

[Series 1: dg chest 2 view · 0.14mm/px · 2 of 2 slices shown]
[im 1/2]
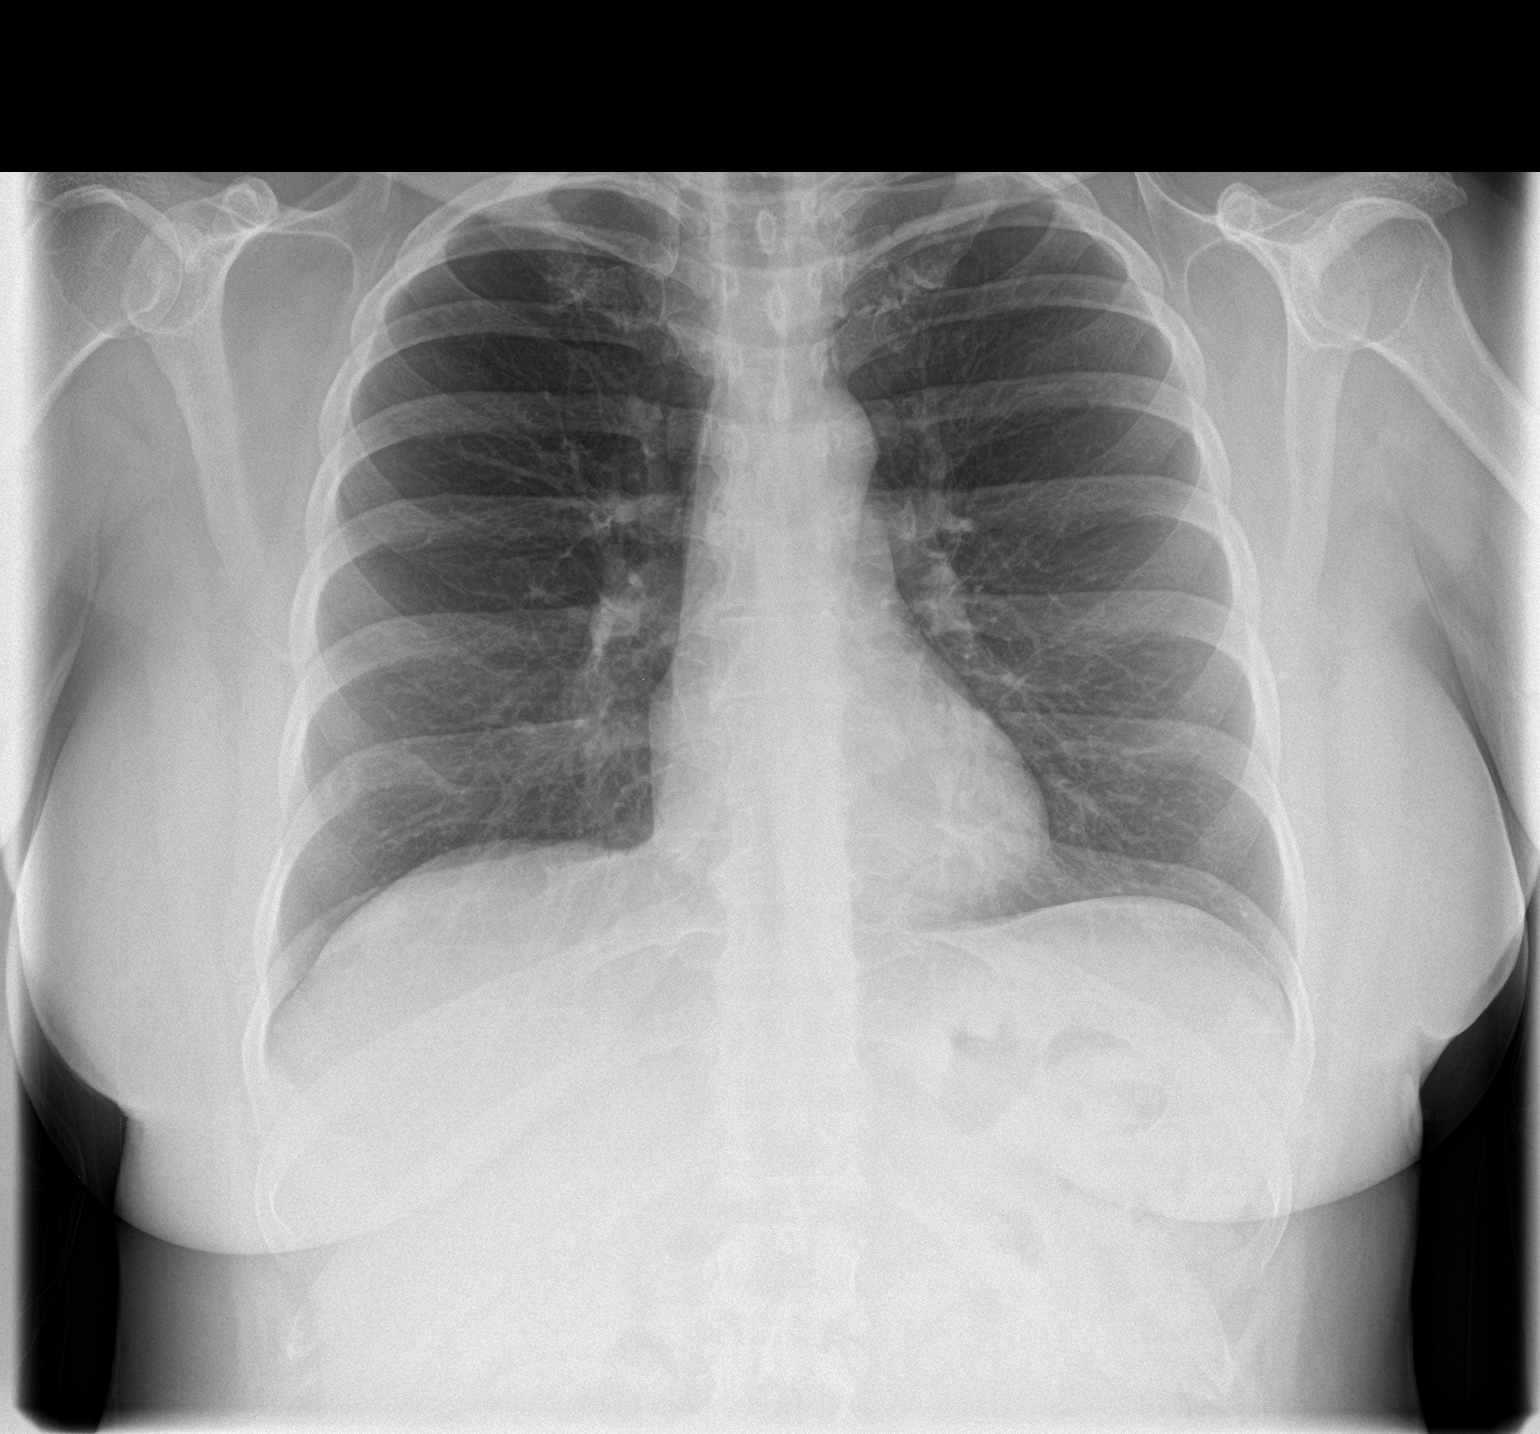
[im 2/2]
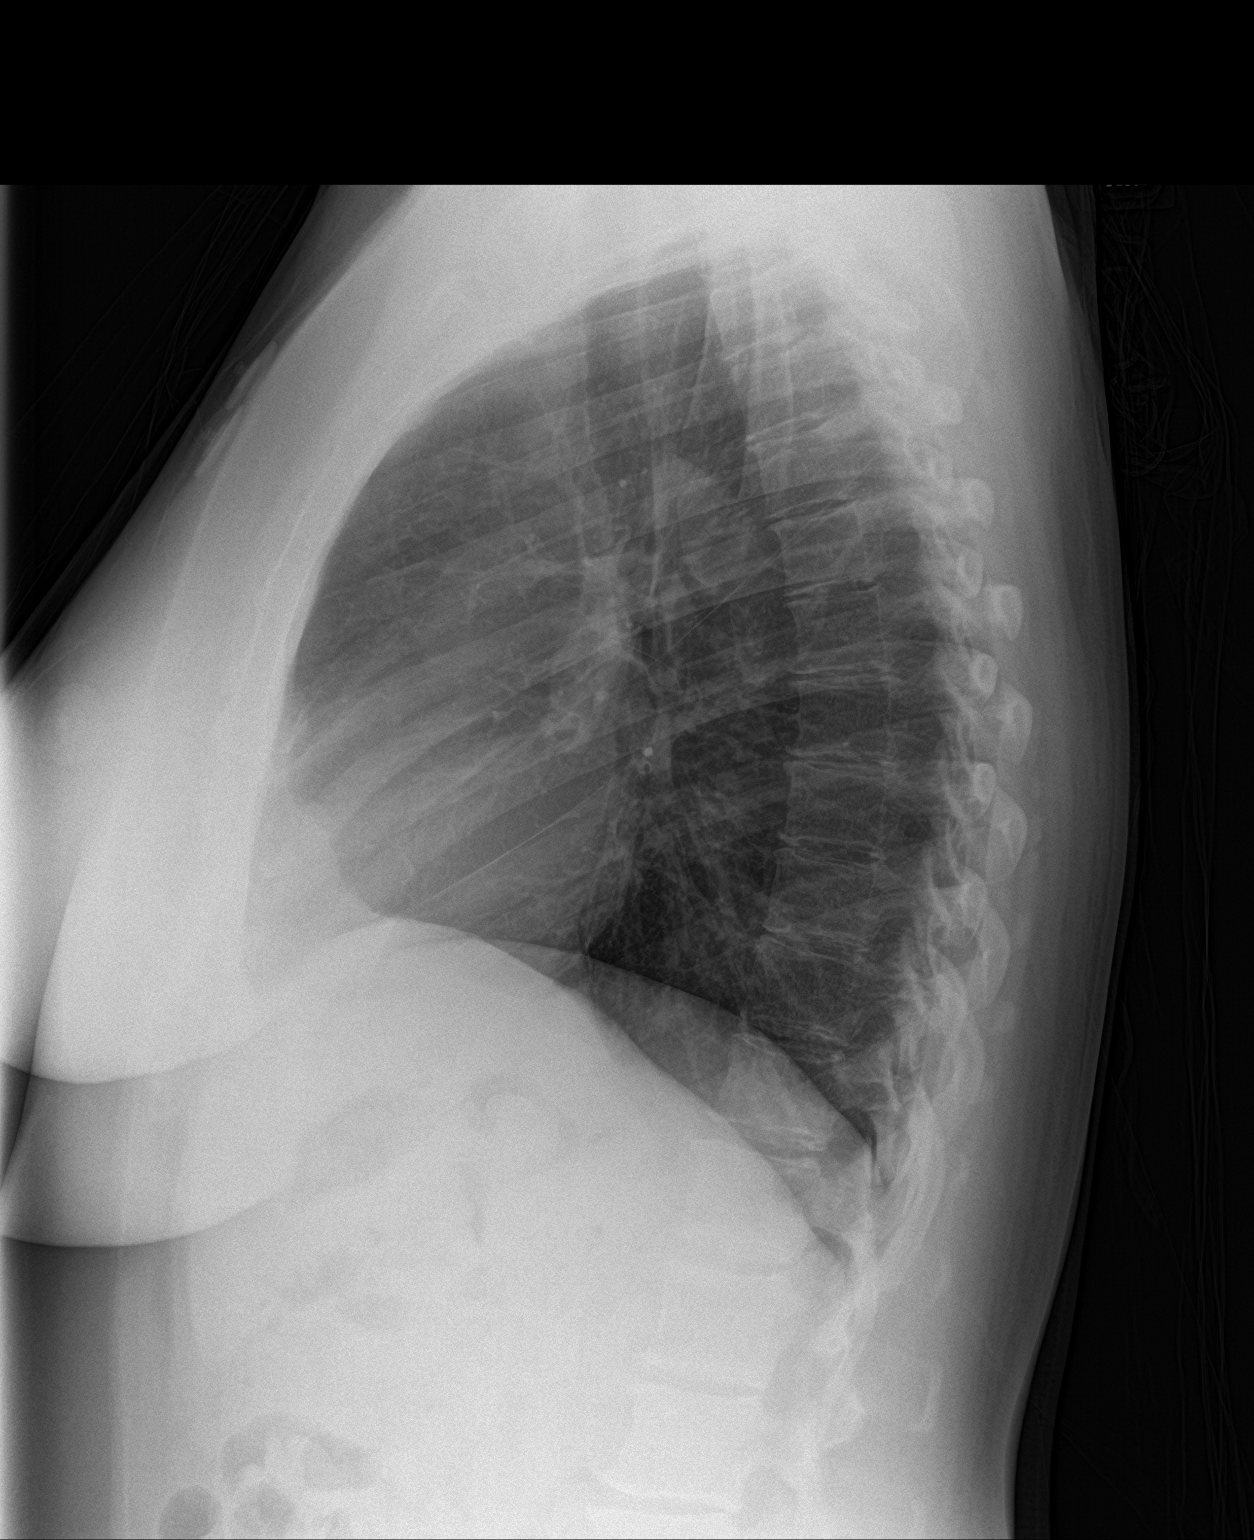

[2 of 2 positions shown; findings below may reference images not displayed]

FINDINGS: Cardiac shadow is within normal limits. The lungs are well aerated
bilaterally. No focal infiltrate or sizable effusion is seen. No
pneumothorax is noted. No bony abnormality is seen.
IMPRESSION: No active cardiopulmonary disease.

## 2019-03-21 IMAGING — US US THYROID
1 series · 13 of 25 positions shown · non-contrast
Comparison: 06/07/2011

CLINICAL DATA: Thyroid nodule.  Neck pain for 8 months.

EXAM:
THYROID ULTRASOUND
TECHNIQUE: Ultrasound examination of the thyroid gland and adjacent soft
tissues was performed.

[Series 1: us thyroid · 0.07mm/px · 13 of 66 slices shown]
[im 1/66]
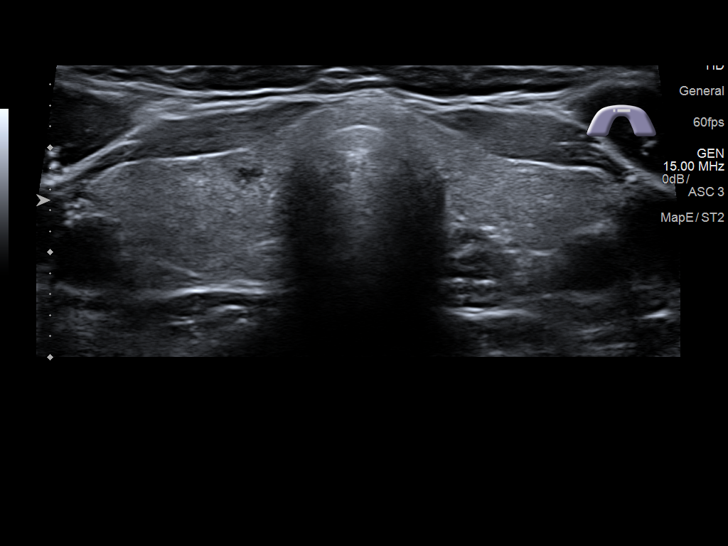
[im 6/66]
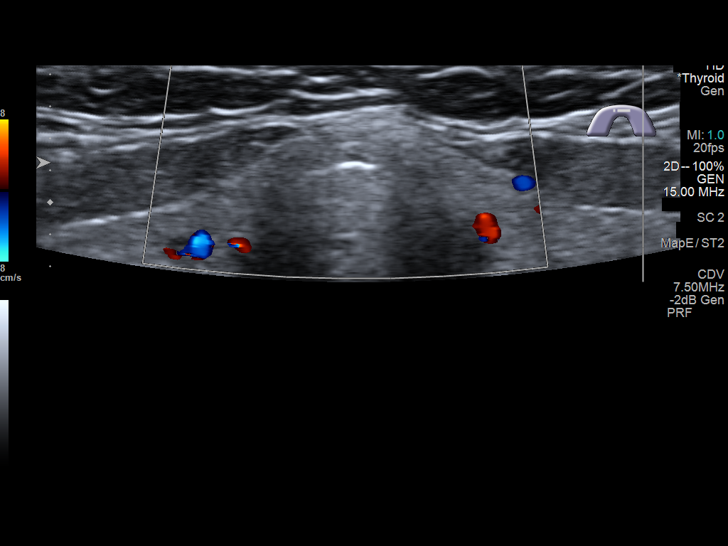
[im 11/66]
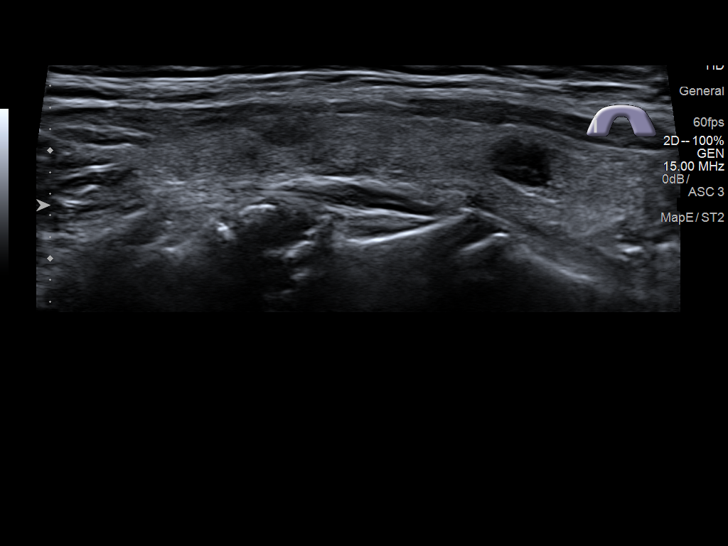
[im 17/66]
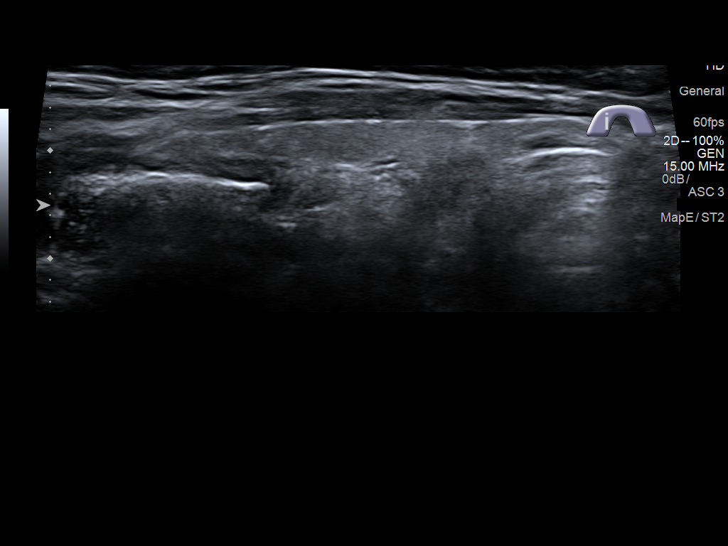
[im 22/66]
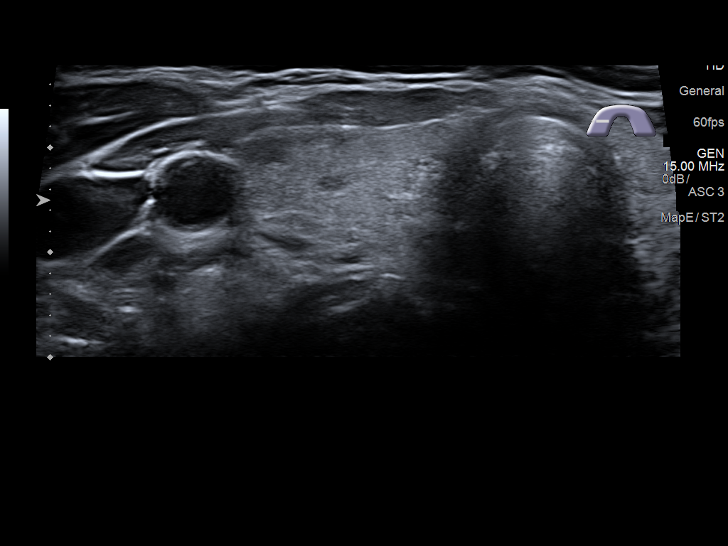
[im 28/66]
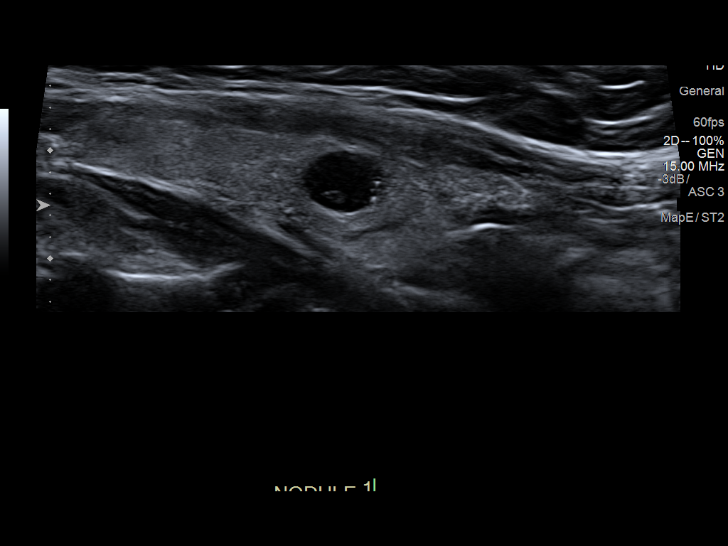
[im 33/66]
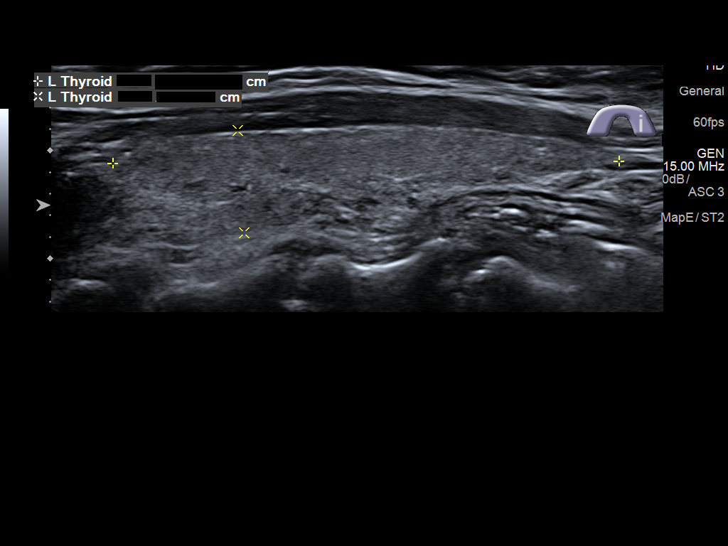
[im 38/66]
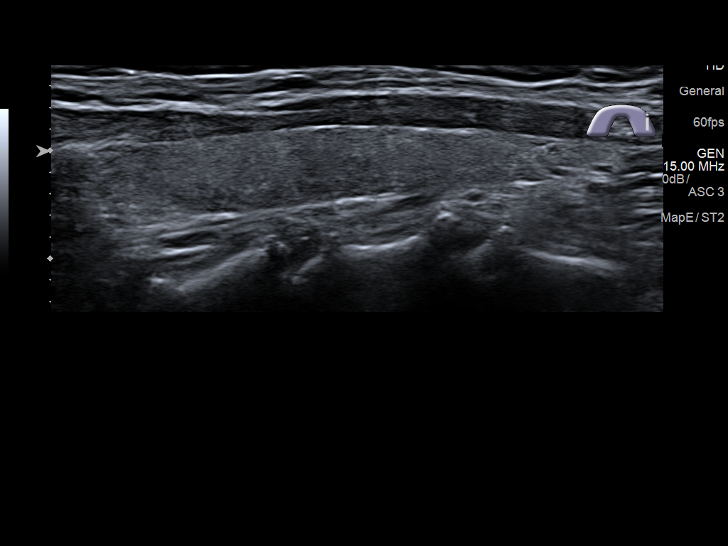
[im 44/66]
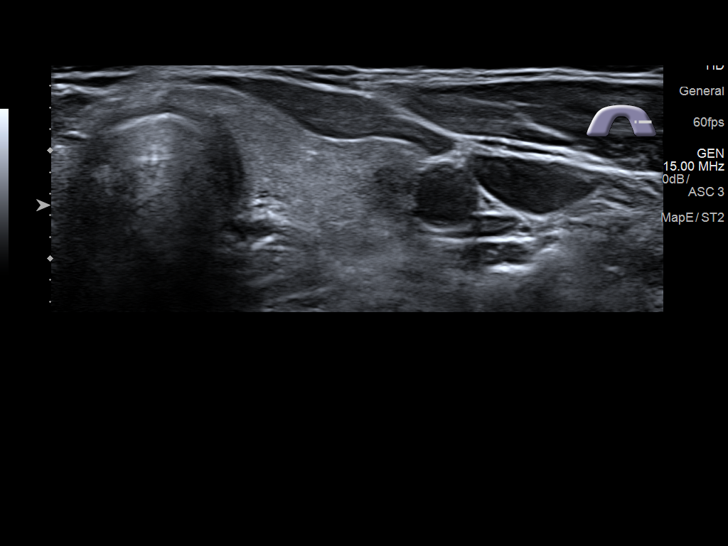
[im 49/66]
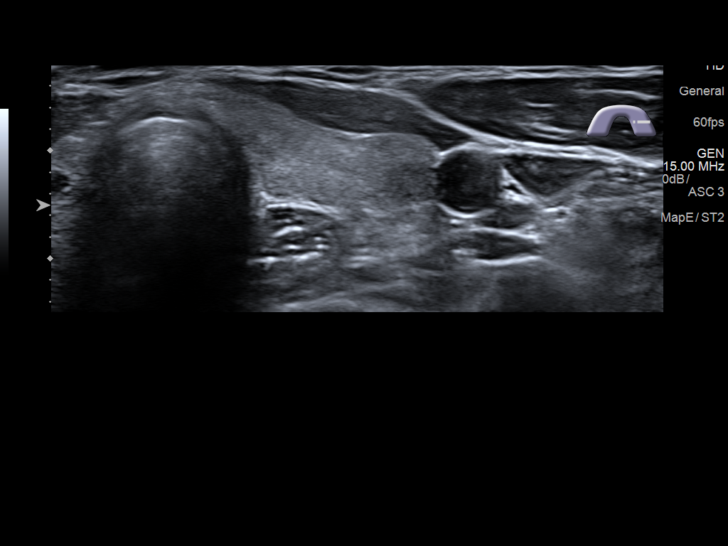
[im 55/66]
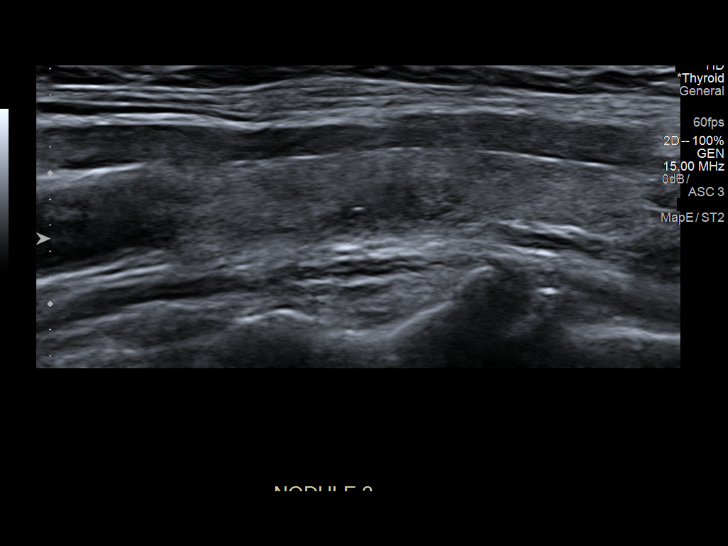
[im 60/66]
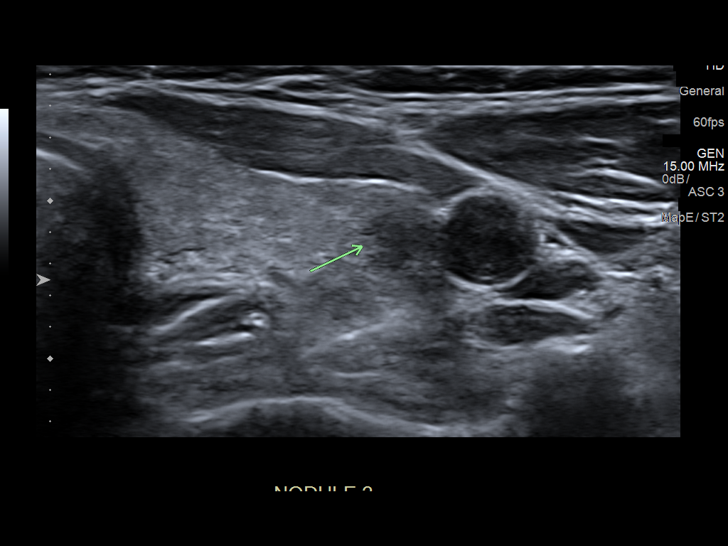
[im 66/66]
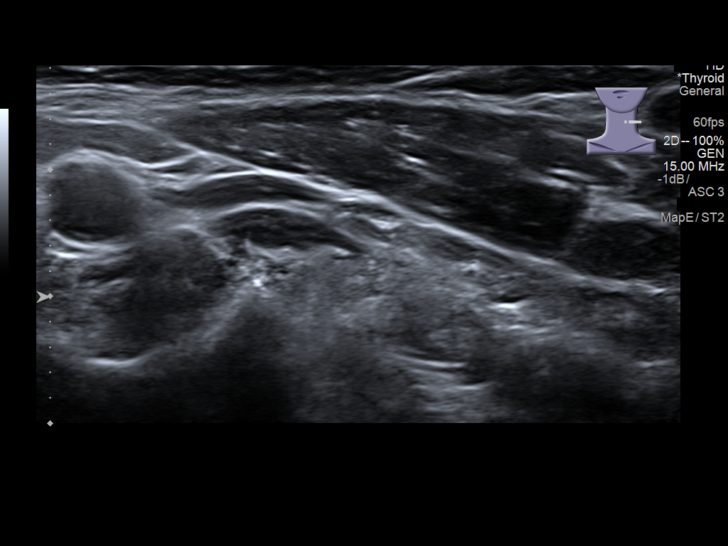

[13 of 25 positions shown; findings below may reference images not displayed]

FINDINGS: Parenchymal Echotexture: Mildly heterogenous

Isthmus: 0.3 cm, previously 0.4 cm

Right lobe: 4.9 x 1.6 x 2.1 cm, previously 5.5 x 1.5 x 1.8 cm

Left lobe: 4.7 x 1.0 x 1.7 cm, previously 5.0 x 1.1 x 1.6 cm

_________________________________________________________

Estimated total number of nodules >/= 1 cm: 1

Number of spongiform nodules >/=  2 cm not described below (TR1): 0

Number of mixed cystic and solid nodules >/= 1.5 cm not described
below (TR2): 0

_________________________________________________________

Nodule # 1:

Prior biopsy: No

Location: Right; Inferior

Maximum size: 0.8 cm; Other 2 dimensions: 0.6 x 0.8 cm, previously,
0.6 x 0.5 x 0.5 cm

Composition: mixed cystic and solid (1)

Echogenicity: cannot determine (1)

Shape: not taller-than-wide (0)

Margins: smooth (0)

Echogenic foci: large comet-tail artifacts (0)

ACR TI-RADS total points: 2.

ACR TI-RADS risk category:  TR2 (2 points).

Significant change in size (>/= 20% in two dimensions and minimal
increase of 2 mm): No

Change in features: No

Change in ACR TI-RADS risk category: No

ACR TI-RADS recommendations:

This nodule does NOT meet TI-RADS criteria for biopsy or dedicated
follow-up.

_________________________________________________________

Nodule # 2:

Prior biopsy: No

Location: Left; Mid

Maximum size: 1.0 cm; Other 2 dimensions: 0.4 x 0.6 cm, previously,
1.0 x 0.5 x 0.5 cm

Composition: solid/almost completely solid (2)

Echogenicity: hypoechoic (2)

Shape: not taller-than-wide (0)

Margins: ill-defined (0)

Echogenic foci: macrocalcifications (1)

ACR TI-RADS total points: 5.

ACR TI-RADS risk category:  TR4 (4-6 points).

Significant change in size (>/= 20% in two dimensions and minimal
increase of 2 mm): No

Change in features: No

Change in ACR TI-RADS risk category: No

ACR TI-RADS recommendations:

*Given size (>/= 1 - 1.4 cm) and appearance, a follow-up ultrasound
in 1 year should be considered based on TI-RADS criteria.

_________________________________________________________

No enlarged lymph nodes.
IMPRESSION: Thyroid nodules.

Left thyroid nodule measures up to 1.0 cm and stable. This nodule
meets criteria for 1 year follow-up.

The above is in keeping with the ACR TI-RADS recommendations - [HOSPITAL] 2232;[DATE].
